# Patient Record
Sex: Female | Born: 1970 | Race: White | Hispanic: No | Marital: Single | Smoking: Never smoker
Health system: Southern US, Community
[De-identification: ages and names within clinical notes are randomized; demographics above are authoritative.]

## PROBLEM LIST (undated history)

## (undated) DIAGNOSIS — M1990 Unspecified osteoarthritis, unspecified site: Secondary | ICD-10-CM

## (undated) DIAGNOSIS — C439 Malignant melanoma of skin, unspecified: Secondary | ICD-10-CM

## (undated) HISTORY — PX: BACK SURGERY: SHX140

## (undated) HISTORY — PX: TONSILLECTOMY: SUR1361

## (undated) HISTORY — DX: Unspecified osteoarthritis, unspecified site: M19.90

---

## 1998-06-08 ENCOUNTER — Other Ambulatory Visit: Admission: RE | Admit: 1998-06-08 | Discharge: 1998-06-08 | Payer: Self-pay | Admitting: Obstetrics and Gynecology

## 1998-06-26 ENCOUNTER — Other Ambulatory Visit: Admission: RE | Admit: 1998-06-26 | Discharge: 1998-06-26 | Payer: Self-pay | Admitting: Obstetrics and Gynecology

## 1998-07-16 ENCOUNTER — Inpatient Hospital Stay (HOSPITAL_COMMUNITY): Admission: AD | Admit: 1998-07-16 | Discharge: 1998-07-16 | Payer: Self-pay | Admitting: Obstetrics & Gynecology

## 1998-10-29 ENCOUNTER — Other Ambulatory Visit: Admission: RE | Admit: 1998-10-29 | Discharge: 1998-10-29 | Payer: Self-pay | Admitting: Obstetrics and Gynecology

## 1999-03-11 ENCOUNTER — Other Ambulatory Visit: Admission: RE | Admit: 1999-03-11 | Discharge: 1999-03-11 | Payer: Self-pay | Admitting: Obstetrics and Gynecology

## 2000-03-15 ENCOUNTER — Other Ambulatory Visit: Admission: RE | Admit: 2000-03-15 | Discharge: 2000-03-15 | Payer: Self-pay | Admitting: Obstetrics and Gynecology

## 2001-03-02 ENCOUNTER — Other Ambulatory Visit: Admission: RE | Admit: 2001-03-02 | Discharge: 2001-03-02 | Payer: Self-pay | Admitting: Obstetrics and Gynecology

## 2002-03-08 ENCOUNTER — Other Ambulatory Visit: Admission: RE | Admit: 2002-03-08 | Discharge: 2002-03-08 | Payer: Self-pay | Admitting: Obstetrics and Gynecology

## 2003-08-13 ENCOUNTER — Other Ambulatory Visit: Admission: RE | Admit: 2003-08-13 | Discharge: 2003-08-13 | Payer: Self-pay | Admitting: Obstetrics and Gynecology

## 2004-09-27 ENCOUNTER — Other Ambulatory Visit: Admission: RE | Admit: 2004-09-27 | Discharge: 2004-09-27 | Payer: Self-pay | Admitting: Obstetrics and Gynecology

## 2012-07-02 DIAGNOSIS — J029 Acute pharyngitis, unspecified: Secondary | ICD-10-CM | POA: Insufficient documentation

## 2013-01-01 DIAGNOSIS — J209 Acute bronchitis, unspecified: Secondary | ICD-10-CM | POA: Insufficient documentation

## 2013-01-01 DIAGNOSIS — R197 Diarrhea, unspecified: Secondary | ICD-10-CM | POA: Insufficient documentation

## 2013-01-10 DIAGNOSIS — R059 Cough, unspecified: Secondary | ICD-10-CM | POA: Insufficient documentation

## 2013-02-27 ENCOUNTER — Observation Stay (HOSPITAL_COMMUNITY)
Admission: EM | Admit: 2013-02-27 | Discharge: 2013-03-02 | Disposition: A | Discharge status: Home / Self Care | Payer: BC Managed Care – PPO | Attending: Orthopaedic Surgery | Admitting: Orthopaedic Surgery

## 2013-02-27 ENCOUNTER — Encounter (HOSPITAL_COMMUNITY): Payer: Self-pay | Admitting: Emergency Medicine

## 2013-02-27 DIAGNOSIS — Z7982 Long term (current) use of aspirin: Secondary | ICD-10-CM | POA: Insufficient documentation

## 2013-02-27 DIAGNOSIS — Z885 Allergy status to narcotic agent status: Secondary | ICD-10-CM | POA: Insufficient documentation

## 2013-02-27 DIAGNOSIS — Z888 Allergy status to other drugs, medicaments and biological substances status: Secondary | ICD-10-CM | POA: Insufficient documentation

## 2013-02-27 DIAGNOSIS — Z882 Allergy status to sulfonamides status: Secondary | ICD-10-CM | POA: Insufficient documentation

## 2013-02-27 DIAGNOSIS — W010XXA Fall on same level from slipping, tripping and stumbling without subsequent striking against object, initial encounter: Secondary | ICD-10-CM | POA: Insufficient documentation

## 2013-02-27 DIAGNOSIS — Z85820 Personal history of malignant melanoma of skin: Secondary | ICD-10-CM | POA: Insufficient documentation

## 2013-02-27 DIAGNOSIS — S82853A Displaced trimalleolar fracture of unspecified lower leg, initial encounter for closed fracture: Principal | ICD-10-CM | POA: Insufficient documentation

## 2013-02-27 HISTORY — DX: Malignant melanoma of skin, unspecified: C43.9

## 2013-02-27 MED ORDER — OXYCODONE-ACETAMINOPHEN 5-325 MG PO TABS
1.0000 | ORAL_TABLET | Freq: Once | ORAL | Status: AC
  Administered 2013-02-27: 1 via ORAL
  Filled 2013-02-27: qty 1

## 2013-02-27 NOTE — ED Notes (Signed)
43 yo female walking to house slipped and fell on snow heard her Left ankle pop. Presents by [facility] with Left ankle deformity, swollen and rotated outward. No LOC or injury to head or there musculoskeletal areas. Painful to touch. No medical HX.

## 2013-02-27 NOTE — ED Notes (Signed)
MD at bedside. 

## 2013-02-27 NOTE — ED Provider Notes (Signed)
CSN: [id number]     Arrival date & time 02/27/13  2242 History   First MD Initiated Contact with Patient 02/27/13 2253     Chief Complaint  Patient presents with  . Ankle Injury     (Consider location/radiation/quality/duration/timing/severity/associated sxs/prior Treatment) HPI  This is a 43 year old female with no significant past medical history who presents with a left ankle injury. Patient states that she was walking on a slippery sidewalk when she slipped and went of her left ankle. She heard a pop. She did not hit her head or lose consciousness. She noted a deformity.  Patient reports the pain is controlled as long as her ankle is immobile. She denies any other injury or pain. She denies syncope.   Past Medical History  Diagnosis Date  . Melanoma     43 yo on back   Past Surgical History  Procedure Laterality Date  . Tonsillectomy     No family history on file. History  Substance Use Topics  . Smoking status: Never Smoker   . Smokeless tobacco: Never Used  . Alcohol Use: No   OB History   Grav Para Term Preterm Abortions TAB SAB Ect Mult Living                 Review of Systems  Respiratory: Negative for chest tightness and shortness of breath.   Cardiovascular: Negative for chest pain.  Gastrointestinal: Positive for nausea and diarrhea. Negative for vomiting and abdominal pain.  Genitourinary: Negative for dysuria.  Musculoskeletal: Negative for back pain.       Left ankle pain and swelling  Skin: Negative for wound.  Neurological: Negative for headaches.  All other systems reviewed and are negative.      Allergies  Codeine; Prednisone; and Sulfa antibiotics  Home Medications   Current Outpatient Rx  Name  Route  Sig  Dispense  Refill  . aspirin EC 81 MG tablet   Oral   Take 81 mg by mouth daily.         . cetirizine (ZYRTEC) 10 MG tablet   Oral   Take 10 mg by mouth daily.         Marland Kitchen omeprazole (PRILOSEC) 40 MG capsule   Oral   Take 40  mg by mouth daily.          BP 121/92  Pulse 97  Temp(Src) 98.7 F (37.1 C) (Oral)  Resp 20  SpO2 97%  LMP 02/24/2013 Physical Exam  Nursing note and vitals reviewed. Constitutional: She is oriented to person, place, and time. She appears well-developed and well-nourished. No distress.  Overweight  HENT:  Head: Normocephalic and atraumatic.  Cardiovascular: Normal rate and regular rhythm.   Pulmonary/Chest: Effort normal. No respiratory distress.  Abdominal: Soft. There is no tenderness.  Musculoskeletal:  Diffuse swelling of the left ankle, notable ecchymosis over the medial malleolus with tenderness to palpation, no midfoot tenderness, 2+ DP pulse, neurovascularly intact distally, no proximal fibular tenderness  Neurological: She is alert and oriented to person, place, and time.  Skin: Skin is warm and dry.  Psychiatric: She has a normal mood and affect.    ED Course  Procedures (including critical care time) Labs Review Labs Reviewed  CBC WITH DIFFERENTIAL  BASIC METABOLIC PANEL  POC URINE PREG, ED   Imaging Review Dg Ankle Complete Left  02/28/2013   CLINICAL DATA:  Fall  EXAM: LEFT ANKLE COMPLETE - 3+ VIEW  COMPARISON:  None.  FINDINGS: There is an acute  oblique fracture through the distal left fibula with intra-articular extension. There is slight lateral displacement at this fracture. Additional fracture of the medial malleolus with slight medial displacement is for present. There is an additional fracture of the posterior malleolus with posterior displacement. There is asymmetric widening of the medial ankle mortise. Talar dome is intact. Calcaneus is intact. Base of fifth metatarsal is intact. Diffuse soft tissue swelling present about the ankle. Calcific density present at the posterior aspect of the calcaneus at the insertion site of the Achilles tendon is likely chronic in nature.  IMPRESSION: Acute trimalleolar fracture as above with associated widening of the ankle  mortise.   Electronically Signed   By: Jeannine Boga M.D.   On: 02/28/2013 00:40    EKG Interpretation   None       MDM   Final diagnoses:  Trimalleolar fracture of ankle, closed    Patient presents with left ankle injury and deformity. No other obvious evidence of injury. She is neurovascularly intact with a good DP pulse. There is no proximal fibular tenderness or deformity. X-rays notable for unstable bimalleolar fracture with mortise widening. Discussed with Dr. Lorin Mercy. Patient will be admitted to Dr. Lorin Mercy service. He will see her in the morning. Temporary admission orders placed. CBC, BMP, urine pregnancy pending.  Short-leg splint placed in the ER.    Merryl Hacker, MD 02/28/13 0111

## 2013-02-28 ENCOUNTER — Observation Stay (HOSPITAL_COMMUNITY): Payer: BC Managed Care – PPO | Admitting: Anesthesiology

## 2013-02-28 ENCOUNTER — Emergency Department (HOSPITAL_COMMUNITY): Payer: BC Managed Care – PPO

## 2013-02-28 ENCOUNTER — Encounter (HOSPITAL_COMMUNITY): Payer: BC Managed Care – PPO | Admitting: Anesthesiology

## 2013-02-28 ENCOUNTER — Encounter (HOSPITAL_COMMUNITY)
Admission: EM | Disposition: A | Discharge status: Home / Self Care | Payer: Self-pay | Source: Home / Self Care | Attending: Emergency Medicine

## 2013-02-28 ENCOUNTER — Encounter (HOSPITAL_COMMUNITY): Payer: Self-pay | Admitting: *Deleted

## 2013-02-28 ENCOUNTER — Observation Stay (HOSPITAL_COMMUNITY): Payer: BC Managed Care – PPO

## 2013-02-28 DIAGNOSIS — S82853A Displaced trimalleolar fracture of unspecified lower leg, initial encounter for closed fracture: Secondary | ICD-10-CM | POA: Diagnosis present

## 2013-02-28 HISTORY — PX: ORIF ANKLE FRACTURE: SHX5408

## 2013-02-28 LAB — BASIC METABOLIC PANEL
BUN: 9 mg/dL (ref 6–23)
CO2: 25 mEq/L (ref 19–32)
Calcium: 9.6 mg/dL (ref 8.4–10.5)
Chloride: 103 mEq/L (ref 96–112)
Creatinine, Ser: 0.66 mg/dL (ref 0.50–1.10)
GFR calc Af Amer: >90 mL/min (ref >90)
GLUCOSE: 107 mg/dL — AB (ref 70–99)
Potassium: 3.9 mEq/L (ref 3.7–5.3)
SODIUM: 140 meq/L (ref 137–147)

## 2013-02-28 LAB — CBC WITH DIFFERENTIAL/PLATELET
Basophils Absolute: 0 10*3/uL (ref 0.0–0.1)
Basophils Relative: 0 % (ref 0–1)
EOS ABS: 0.1 10*3/uL (ref 0.0–0.7)
EOS PCT: 1 % (ref 0–5)
HCT: 37.9 % (ref 36.0–46.0)
Hemoglobin: 12.8 g/dL (ref 12.0–15.0)
LYMPHS ABS: 1.2 10*3/uL (ref 0.7–4.0)
Lymphocytes Relative: 9 % — ABNORMAL LOW (ref 12–46)
MCH: 28.8 pg (ref 26.0–34.0)
MCHC: 33.8 g/dL (ref 30.0–36.0)
MCV: 85.4 fL (ref 78.0–100.0)
Monocytes Absolute: 0.8 10*3/uL (ref 0.1–1.0)
Monocytes Relative: 6 % (ref 3–12)
Neutro Abs: 11.4 10*3/uL — ABNORMAL HIGH (ref 1.7–7.7)
Neutrophils Relative %: 84 % — ABNORMAL HIGH (ref 43–77)
PLATELETS: 341 10*3/uL (ref 150–400)
RBC: 4.44 MIL/uL (ref 3.87–5.11)
RDW: 13.4 % (ref 11.5–15.5)
WBC: 13.5 10*3/uL — ABNORMAL HIGH (ref 4.0–10.5)

## 2013-02-28 LAB — POC URINE PREG, ED: Preg Test, Ur: NEGATIVE

## 2013-02-28 SURGERY — OPEN REDUCTION INTERNAL FIXATION (ORIF) ANKLE FRACTURE
Anesthesia: Regional | Site: Ankle | Laterality: Left

## 2013-02-28 MED ORDER — HYDROMORPHONE HCL PF 1 MG/ML IJ SOLN
1.0000 mg | INTRAMUSCULAR | Status: AC | PRN
  Administered 2013-02-28 (×2): 1 mg via INTRAVENOUS
  Filled 2013-02-28 (×2): qty 1

## 2013-02-28 MED ORDER — DEXTROSE 5 % IV SOLN
INTRAVENOUS | Status: DC | PRN
  Administered 2013-02-28: 13:00:00 via INTRAVENOUS

## 2013-02-28 MED ORDER — MIDAZOLAM HCL 2 MG/2ML IJ SOLN
INTRAMUSCULAR | Status: AC
  Filled 2013-02-28: qty 2

## 2013-02-28 MED ORDER — PANTOPRAZOLE SODIUM 40 MG PO TBEC
40.0000 mg | DELAYED_RELEASE_TABLET | Freq: Every day | ORAL | Status: DC
  Administered 2013-02-28 – 2013-03-02 (×2): 40 mg via ORAL

## 2013-02-28 MED ORDER — DOCUSATE SODIUM 100 MG PO CAPS
100.0000 mg | ORAL_CAPSULE | Freq: Two times a day (BID) | ORAL | Status: DC
  Administered 2013-02-28 – 2013-03-02 (×4): 100 mg via ORAL
  Filled 2013-02-28 (×4): qty 1

## 2013-02-28 MED ORDER — LACTATED RINGERS IV SOLN
INTRAVENOUS | Status: DC | PRN
  Administered 2013-02-28: 11:00:00 via INTRAVENOUS

## 2013-02-28 MED ORDER — ONDANSETRON HCL 4 MG/2ML IJ SOLN
4.0000 mg | Freq: Once | INTRAMUSCULAR | Status: AC
  Administered 2013-02-28: 4 mg via INTRAVENOUS
  Filled 2013-02-28: qty 2

## 2013-02-28 MED ORDER — HYDROMORPHONE HCL PF 1 MG/ML IJ SOLN
0.5000 mg | INTRAMUSCULAR | Status: DC | PRN
Start: 2013-02-28 — End: 2013-03-02
  Administered 2013-03-01: 1 mg via INTRAVENOUS
  Filled 2013-02-28: qty 1

## 2013-02-28 MED ORDER — METHOCARBAMOL 500 MG PO TABS
500.0000 mg | ORAL_TABLET | Freq: Four times a day (QID) | ORAL | Status: DC | PRN
Start: 2013-02-28 — End: 2013-03-02
  Administered 2013-03-01 – 2013-03-02 (×3): 500 mg via ORAL
  Filled 2013-02-28 (×3): qty 1

## 2013-02-28 MED ORDER — FENTANYL CITRATE 0.05 MG/ML IJ SOLN
INTRAMUSCULAR | Status: AC
  Administered 2013-02-28: 100 ug
  Filled 2013-02-28: qty 2

## 2013-02-28 MED ORDER — CEFAZOLIN SODIUM-DEXTROSE 2-3 GM-% IV SOLR
2.0000 g | Freq: Once | INTRAVENOUS | Status: AC
  Administered 2013-02-28: 2 g via INTRAVENOUS
  Filled 2013-02-28 (×2): qty 50

## 2013-02-28 MED ORDER — BUPIVACAINE HCL (PF) 0.5 % IJ SOLN
INTRAMUSCULAR | Status: DC | PRN
  Administered 2013-02-28: 15 mL

## 2013-02-28 MED ORDER — PROPOFOL 10 MG/ML IV BOLUS
INTRAVENOUS | Status: DC | PRN
  Administered 2013-02-28: 150 mg via INTRAVENOUS
  Administered 2013-02-28: 20 mg via INTRAVENOUS

## 2013-02-28 MED ORDER — POLYETHYLENE GLYCOL 3350 17 G PO PACK
17.0000 g | PACK | Freq: Every day | ORAL | Status: DC | PRN
  Administered 2013-03-01: 17 g via ORAL
  Filled 2013-02-28 (×2): qty 1

## 2013-02-28 MED ORDER — LIDOCAINE HCL (CARDIAC) 20 MG/ML IV SOLN
INTRAVENOUS | Status: AC
  Filled 2013-02-28: qty 5

## 2013-02-28 MED ORDER — METOCLOPRAMIDE HCL 5 MG/ML IJ SOLN
5.0000 mg | Freq: Three times a day (TID) | INTRAMUSCULAR | Status: DC | PRN
  Filled 2013-02-28: qty 2

## 2013-02-28 MED ORDER — SODIUM CHLORIDE 0.9 % IV SOLN
INTRAVENOUS | Status: AC
  Administered 2013-02-28: 1000 mL via INTRAVENOUS

## 2013-02-28 MED ORDER — FENTANYL CITRATE 0.05 MG/ML IJ SOLN
INTRAMUSCULAR | Status: DC | PRN
  Administered 2013-02-28: 100 ug via INTRAVENOUS

## 2013-02-28 MED ORDER — CEFAZOLIN SODIUM-DEXTROSE 2-3 GM-% IV SOLR
2.0000 g | Freq: Once | INTRAVENOUS | Status: DC
  Filled 2013-02-28: qty 50

## 2013-02-28 MED ORDER — 0.9 % SODIUM CHLORIDE (POUR BTL) OPTIME
TOPICAL | Status: DC | PRN
  Administered 2013-02-28: 1000 mL

## 2013-02-28 MED ORDER — LIDOCAINE HCL (CARDIAC) 20 MG/ML IV SOLN
INTRAVENOUS | Status: DC | PRN
  Administered 2013-02-28: 60 mg via INTRAVENOUS

## 2013-02-28 MED ORDER — ASPIRIN EC 81 MG PO TBEC
81.0000 mg | DELAYED_RELEASE_TABLET | Freq: Every day | ORAL | Status: DC
Start: 2013-02-28 — End: 2013-03-02
  Administered 2013-02-28 – 2013-03-02 (×3): 81 mg via ORAL
  Filled 2013-02-28 (×4): qty 1

## 2013-02-28 MED ORDER — BUPIVACAINE-EPINEPHRINE PF 0.5-1:200000 % IJ SOLN
INTRAMUSCULAR | Status: DC | PRN
  Administered 2013-02-28: 30 mL via PERINEURAL

## 2013-02-28 MED ORDER — CEFAZOLIN SODIUM-DEXTROSE 2-3 GM-% IV SOLR
INTRAVENOUS | Status: DC | PRN
  Administered 2013-02-28: 2 g via INTRAVENOUS

## 2013-02-28 MED ORDER — ARTIFICIAL TEARS OP OINT
TOPICAL_OINTMENT | OPHTHALMIC | Status: DC | PRN
  Administered 2013-02-28: 1 via OPHTHALMIC

## 2013-02-28 MED ORDER — HYDROMORPHONE HCL PF 1 MG/ML IJ SOLN
1.0000 mg | Freq: Once | INTRAMUSCULAR | Status: AC
  Administered 2013-02-28: 1 mg via INTRAVENOUS
  Filled 2013-02-28: qty 1

## 2013-02-28 MED ORDER — MIDAZOLAM HCL 2 MG/2ML IJ SOLN
INTRAMUSCULAR | Status: AC
  Administered 2013-02-28: 2 mg
  Filled 2013-02-28: qty 2

## 2013-02-28 MED ORDER — ONDANSETRON HCL 4 MG/2ML IJ SOLN: 4.0000 mg | Freq: Four times a day (QID) | INTRAMUSCULAR | Status: DC | PRN

## 2013-02-28 MED ORDER — ONDANSETRON HCL 4 MG/2ML IJ SOLN
4.0000 mg | Freq: Three times a day (TID) | INTRAMUSCULAR | Status: DC | PRN
  Administered 2013-02-28: 4 mg via INTRAVENOUS
  Filled 2013-02-28: qty 2

## 2013-02-28 MED ORDER — ARTIFICIAL TEARS OP OINT
TOPICAL_OINTMENT | OPHTHALMIC | Status: AC
  Filled 2013-02-28: qty 3.5

## 2013-02-28 MED ORDER — OXYCODONE-ACETAMINOPHEN 5-325 MG PO TABS
1.0000 | ORAL_TABLET | ORAL | Status: DC | PRN
  Administered 2013-03-01 – 2013-03-02 (×6): 2 via ORAL
  Filled 2013-02-28 (×6): qty 2

## 2013-02-28 MED ORDER — HYDROMORPHONE HCL PF 1 MG/ML IJ SOLN
INTRAMUSCULAR | Status: AC
  Filled 2013-02-28: qty 1

## 2013-02-28 MED ORDER — ONDANSETRON HCL 4 MG/2ML IJ SOLN
INTRAMUSCULAR | Status: AC
  Filled 2013-02-28: qty 2

## 2013-02-28 MED ORDER — ONDANSETRON HCL 4 MG PO TABS: 4.0000 mg | ORAL_TABLET | Freq: Four times a day (QID) | ORAL | Status: DC | PRN

## 2013-02-28 MED ORDER — PROPOFOL 10 MG/ML IV BOLUS
INTRAVENOUS | Status: AC
Start: 2013-02-28 — End: 2013-02-28
  Filled 2013-02-28: qty 20

## 2013-02-28 MED ORDER — MORPHINE SULFATE 4 MG/ML IJ SOLN
4.0000 mg | Freq: Once | INTRAMUSCULAR | Status: AC
  Administered 2013-02-28: 4 mg via INTRAVENOUS
  Filled 2013-02-28: qty 1

## 2013-02-28 MED ORDER — ONDANSETRON HCL 4 MG/2ML IJ SOLN
INTRAMUSCULAR | Status: DC | PRN
  Administered 2013-02-28: 4 mg via INTRAVENOUS

## 2013-02-28 MED ORDER — METOCLOPRAMIDE HCL 5 MG PO TABS
5.0000 mg | ORAL_TABLET | Freq: Three times a day (TID) | ORAL | Status: DC | PRN
  Filled 2013-02-28: qty 2

## 2013-02-28 MED ORDER — LORATADINE 10 MG PO TABS
10.0000 mg | ORAL_TABLET | Freq: Every day | ORAL | Status: DC
  Administered 2013-02-28 – 2013-03-02 (×3): 10 mg via ORAL
  Filled 2013-02-28 (×3): qty 1

## 2013-02-28 MED ORDER — BUPIVACAINE HCL (PF) 0.5 % IJ SOLN: INTRAMUSCULAR | Status: DC | PRN

## 2013-02-28 MED ORDER — HYDROMORPHONE HCL PF 1 MG/ML IJ SOLN
0.2500 mg | INTRAMUSCULAR | Status: DC | PRN
  Administered 2013-02-28: 0.5 mg via INTRAVENOUS

## 2013-02-28 MED ORDER — BUPIVACAINE HCL (PF) 0.25 % IJ SOLN
INTRAMUSCULAR | Status: AC
  Filled 2013-02-28: qty 30

## 2013-02-28 MED ORDER — METHOCARBAMOL 100 MG/ML IJ SOLN
500.0000 mg | Freq: Four times a day (QID) | INTRAMUSCULAR | Status: DC | PRN
  Filled 2013-02-28: qty 5

## 2013-02-28 MED ORDER — BUPIVACAINE HCL (PF) 0.25 % IJ SOLN
INTRAMUSCULAR | Status: DC | PRN
Start: 2013-02-28 — End: 2013-02-28
  Administered 2013-02-28: 10 mL

## 2013-02-28 MED ORDER — SODIUM CHLORIDE 0.9 % IV SOLN
INTRAVENOUS | Status: DC
  Administered 2013-03-01: 08:00:00 via INTRAVENOUS

## 2013-02-28 MED ORDER — CEFAZOLIN SODIUM-DEXTROSE 2-3 GM-% IV SOLR
INTRAVENOUS | Status: AC
  Filled 2013-02-28: qty 50

## 2013-02-28 MED ORDER — FENTANYL CITRATE 0.05 MG/ML IJ SOLN
INTRAMUSCULAR | Status: AC
  Filled 2013-02-28: qty 5

## 2013-02-28 SURGICAL SUPPLY — 66 items
BANDAGE ELASTIC 4 VELCRO ST LF (GAUZE/BANDAGES/DRESSINGS) ×2 IMPLANT
BANDAGE ELASTIC 6 VELCRO ST LF (GAUZE/BANDAGES/DRESSINGS) ×2 IMPLANT
BANDAGE ESMARK 6X9 LF (GAUZE/BANDAGES/DRESSINGS) IMPLANT
BIT DRILL 2.5X110 QC LCP DISP (BIT) ×2 IMPLANT
BNDG CMPR 9X6 STRL LF SNTH (GAUZE/BANDAGES/DRESSINGS)
BNDG ESMARK 6X9 LF (GAUZE/BANDAGES/DRESSINGS)
CLOTH BEACON ORANGE TIMEOUT ST (SAFETY) ×3 IMPLANT
COVER MAYO STAND STRL (DRAPES) ×5 IMPLANT
COVER SURGICAL LIGHT HANDLE (MISCELLANEOUS) ×5 IMPLANT
CUFF TOURNIQUET SINGLE 34IN LL (TOURNIQUET CUFF) ×2 IMPLANT
CUFF TOURNIQUET SINGLE 44IN (TOURNIQUET CUFF) IMPLANT
DRAPE C-ARM 42X72 X-RAY (DRAPES) ×2 IMPLANT
DRAPE INCISE IOBAN 66X45 STRL (DRAPES) ×3 IMPLANT
DRAPE PROXIMA HALF (DRAPES) ×3 IMPLANT
DRAPE U-SHAPE 47X51 STRL (DRAPES) ×3 IMPLANT
DRSG PAD ABDOMINAL 8X10 ST (GAUZE/BANDAGES/DRESSINGS) ×3 IMPLANT
DURAPREP 26ML APPLICATOR (WOUND CARE) ×3 IMPLANT
ELECT REM PT RETURN 9FT ADLT (ELECTROSURGICAL) ×3
ELECTRODE REM PT RTRN 9FT ADLT (ELECTROSURGICAL) ×1 IMPLANT
GAUZE XEROFORM 5X9 LF (GAUZE/BANDAGES/DRESSINGS) ×3 IMPLANT
GLOVE BIOGEL PI IND STRL 7.5 (GLOVE) ×1 IMPLANT
GLOVE BIOGEL PI IND STRL 8 (GLOVE) ×1 IMPLANT
GLOVE BIOGEL PI INDICATOR 7.5 (GLOVE) ×2
GLOVE BIOGEL PI INDICATOR 8 (GLOVE) ×2
GLOVE ECLIPSE 7.0 STRL STRAW (GLOVE) ×3 IMPLANT
GLOVE ORTHO TXT STRL SZ7.5 (GLOVE) ×3 IMPLANT
GOWN PREVENTION PLUS LG XLONG (DISPOSABLE) IMPLANT
GOWN PREVENTION PLUS XLARGE (GOWN DISPOSABLE) ×3 IMPLANT
GOWN STRL NON-REIN LRG LVL3 (GOWN DISPOSABLE) ×6 IMPLANT
KIT 1/3 TUB PL 7H 85M (Orthopedic Implant) IMPLANT
KIT BASIN OR (CUSTOM PROCEDURE TRAY) ×3 IMPLANT
KIT ROOM TURNOVER OR (KITS) ×3 IMPLANT
MANIFOLD NEPTUNE II (INSTRUMENTS) ×3 IMPLANT
NS IRRIG 1000ML POUR BTL (IV SOLUTION) ×3 IMPLANT
PACK ORTHO EXTREMITY (CUSTOM PROCEDURE TRAY) ×3 IMPLANT
PAD ARMBOARD 7.5X6 YLW CONV (MISCELLANEOUS) ×6 IMPLANT
PAD CAST 4YDX4 CTTN HI CHSV (CAST SUPPLIES) ×1 IMPLANT
PADDING CAST COTTON 4X4 STRL (CAST SUPPLIES) ×3
PADDING CAST COTTON 6X4 STRL (CAST SUPPLIES) ×3 IMPLANT
PADDING CAST SYN 6 (CAST SUPPLIES) ×2
PADDING CAST SYNTHETIC 6X4 NS (CAST SUPPLIES) IMPLANT
PROS 1/3 TUB PL 7H 85M (Orthopedic Implant) ×3 IMPLANT
SCREW CANC FT 12 4.0 (Screw) ×2 IMPLANT
SCREW CANC FT ST SFS 4X14 (Screw) ×2 IMPLANT
SCREW CANC PT 4.0X40 (Screw) ×3 IMPLANT
SCREW CANC PT 4.0X45 (Screw) ×2 IMPLANT
SCREW CANC PT 40X14X4X6X (Screw) IMPLANT
SCREW CORTEX 3.5 12MM (Screw) ×4 IMPLANT
SCREW CORTEX 3.5 14MM (Screw) ×2 IMPLANT
SCREW CORTEX 3.5 22MM (Screw) ×2 IMPLANT
SCREW LOCK CORT ST 3.5X12 (Screw) IMPLANT
SCREW LOCK CORT ST 3.5X14 (Screw) IMPLANT
SCREW LOCK CORT ST 3.5X22 (Screw) IMPLANT
SPONGE GAUZE 4X4 12PLY (GAUZE/BANDAGES/DRESSINGS) ×5 IMPLANT
SPONGE LAP 18X18 X RAY DECT (DISPOSABLE) ×3 IMPLANT
STAPLER VISISTAT 35W (STAPLE) ×2 IMPLANT
SUCTION FRAZIER TIP 10 FR DISP (SUCTIONS) ×3 IMPLANT
SUT ETHILON 3 0 PS 1 (SUTURE) ×2 IMPLANT
SUT VIC AB 2-0 CT1 27 (SUTURE) ×6
SUT VIC AB 2-0 CT1 TAPERPNT 27 (SUTURE) ×2 IMPLANT
TOWEL OR 17X24 6PK STRL BLUE (TOWEL DISPOSABLE) ×3 IMPLANT
TOWEL OR 17X26 10 PK STRL BLUE (TOWEL DISPOSABLE) ×3 IMPLANT
TUBE CONNECTING 12'X1/4 (SUCTIONS) ×1
TUBE CONNECTING 12X1/4 (SUCTIONS) ×2 IMPLANT
WATER STERILE IRR 1000ML POUR (IV SOLUTION) ×3 IMPLANT
YANKAUER SUCT BULB TIP NO VENT (SUCTIONS) ×3 IMPLANT

## 2013-02-28 NOTE — Progress Notes (Signed)
Called for report, first attempt. ED RN to call back for report.

## 2013-02-28 NOTE — H&P (Signed)
[name] [name] is an 43 y.o. female.   Chief Complaint: slipped going down the hill to check on house next door. Left ankle deformity and pain.  HPI: no Hx of ankle injury, works as Medical illustrator, lives in [place].   Past Medical History  Diagnosis Date  . Melanoma     43 yo on back    Past Surgical History  Procedure Laterality Date  . Tonsillectomy      History reviewed. No pertinent family history. Social History:  reports that she has never smoked. She has never used smokeless tobacco. She reports that she does not drink alcohol or use illicit drugs.  Allergies:  Allergies  Allergen Reactions  . Codeine Itching and Nausea And Vomiting  . Prednisone Itching  . Sulfa Antibiotics Itching    Medications Prior to Admission  Medication Sig Dispense Refill  . aspirin EC 81 MG tablet Take 81 mg by mouth daily.      . cetirizine (ZYRTEC) 10 MG tablet Take 10 mg by mouth daily.      Marland Kitchen omeprazole (PRILOSEC) 40 MG capsule Take 40 mg by mouth daily.        Results for orders placed during the hospital encounter of 02/27/13 (from the past 48 hour(s))  CBC WITH DIFFERENTIAL     Status: Abnormal   Collection Time    02/28/13 12:52 AM      Result Value Ref Range   WBC 13.5 (*) 4.0 - 10.5 K/uL   RBC 4.44  3.87 - 5.11 MIL/uL   Hemoglobin 12.8  12.0 - 15.0 g/dL   HCT 37.9  36.0 - 46.0 %   MCV 85.4  78.0 - 100.0 fL   MCH 28.8  26.0 - 34.0 pg   MCHC 33.8  30.0 - 36.0 g/dL   RDW 13.4  11.5 - 15.5 %   Platelets 341  150 - 400 K/uL   Neutrophils Relative % 84 (*) 43 - 77 %   Neutro Abs 11.4 (*) 1.7 - 7.7 K/uL   Lymphocytes Relative 9 (*) 12 - 46 %   Lymphs Abs 1.2  0.7 - 4.0 K/uL   Monocytes Relative 6  3 - 12 %   Monocytes Absolute 0.8  0.1 - 1.0 K/uL   Eosinophils Relative 1  0 - 5 %   Eosinophils Absolute 0.1  0.0 - 0.7 K/uL   Basophils Relative 0  0 - 1 %   Basophils Absolute 0.0  0.0 - 0.1 K/uL  BASIC METABOLIC PANEL     Status: Abnormal   Collection Time    02/28/13  12:52 AM      Result Value Ref Range   Sodium 140  137 - 147 mEq/L   Potassium 3.9  3.7 - 5.3 mEq/L   Chloride 103  96 - 112 mEq/L   CO2 25  19 - 32 mEq/L   Glucose, Bld 107 (*) 70 - 99 mg/dL   BUN 9  6 - 23 mg/dL   Creatinine, Ser 0.66  0.50 - 1.10 mg/dL   Calcium 9.6  8.4 - 10.5 mg/dL   GFR calc non Af Amer >90  >90 mL/min   GFR calc Af Amer >90  >90 mL/min   Comment: (NOTE)     The eGFR has been calculated using the CKD EPI equation.     This calculation has not been validated in all clinical situations.     eGFR's persistently <90 mL/min signify possible Chronic Kidney  Disease.  POC URINE PREG, ED     Status: None   Collection Time    02/28/13  1:40 AM      Result Value Ref Range   Preg Test, Ur NEGATIVE  NEGATIVE   Comment:            THE SENSITIVITY OF THIS     METHODOLOGY IS >24 mIU/mL   Dg Ankle Complete Left  02/28/2013   CLINICAL DATA:  Fall  EXAM: LEFT ANKLE COMPLETE - 3+ VIEW  COMPARISON:  None.  FINDINGS: There is an acute oblique fracture through the distal left fibula with intra-articular extension. There is slight lateral displacement at this fracture. Additional fracture of the medial malleolus with slight medial displacement is for present. There is an additional fracture of the posterior malleolus with posterior displacement. There is asymmetric widening of the medial ankle mortise. Talar dome is intact. Calcaneus is intact. Base of fifth metatarsal is intact. Diffuse soft tissue swelling present about the ankle. Calcific density present at the posterior aspect of the calcaneus at the insertion site of the Achilles tendon is likely chronic in nature.  IMPRESSION: Acute trimalleolar fracture as above with associated widening of the ankle mortise.   Electronically Signed   By: Jeannine Boga M.D.   On: 02/28/2013 00:40    Review of Systems  Constitutional: Negative for fever, chills and weight loss.  Respiratory:       Pneumonia 4 yrs ago tx with ABX .    Gastrointestinal: Positive for heartburn.  Genitourinary:       Cervical conization  Musculoskeletal:       Previous right ankle fx 8th grade  Skin: Negative for itching and rash.  Endo/Heme/Allergies:       Melanoma age 5.   Psychiatric/Behavioral: Negative.     Blood pressure 114/70, pulse 81, temperature 97.9 F (36.6 C), temperature source Oral, resp. rate 16, height 5' 8"  (1.727 m), weight 115.7 kg (255 lb 1.2 oz), last menstrual period 02/24/2013, SpO2 96.00%. Physical Exam  Constitutional: She is oriented to person, place, and time. She appears well-developed and well-nourished.  HENT:  Head: Normocephalic and atraumatic.  Eyes: Pupils are equal, round, and reactive to light.  Neck: Normal range of motion.  Cardiovascular: Normal rate and regular rhythm.   Respiratory: Effort normal.  GI: Soft.  Musculoskeletal:  Left SLS . Sensation to toes normal  Neurological: She is alert and oriented to person, place, and time.  Skin: Skin is warm and dry.     Assessment/Plan   Left trimalleolar  Ankle fracture  For ORIF later this morning. Procedure discussed, NWB post op.   YATES,MARK C 02/28/2013, 9:05 AM

## 2013-02-28 NOTE — Transfer of Care (Signed)
Immediate Anesthesia Transfer of Care Note  Patient: [name] [name]  Procedure(s) Performed: Procedure(s): OPEN REDUCTION INTERNAL FIXATION (ORIF)  TRIMALEALLOR ANKLE FRACTURE (Left)  Patient Location: PACU  Anesthesia Type:General  Level of Consciousness: awake, sedated and patient cooperative  Airway & Oxygen Therapy: Patient Spontanous Breathing and Patient connected to nasal cannula oxygen  Post-op Assessment: Report given to PACU RN and Post -op Vital signs reviewed and stable  Post vital signs: Reviewed and stable  Complications: No apparent anesthesia complications

## 2013-02-28 NOTE — Brief Op Note (Signed)
02/27/2013 - 02/28/2013  2:27 PM  PATIENT:  [name] [name]  43 y.o. female  PRE-OPERATIVE DIAGNOSIS:  left ankle fracture, trimalleolar  POST-OPERATIVE DIAGNOSIS:  left ankle fracture,trimalleolar  PROCEDURE:  Procedure(s): OPEN REDUCTION INTERNAL FIXATION (ORIF)  TRIMALEALLOR ANKLE FRACTURE (Left)  SURGEON:  Surgeon(s) and Role:    * Marybelle Killings, MD - Primary  PHYSICIAN ASSISTANT:   ASSISTANTS: none   ANESTHESIA:   local, regional and general  EBL:  Total I/O In: 50 [I.V.:50] Out: -   BLOOD ADMINISTERED:none  DRAINS: none   LOCAL MEDICATIONS USED:  MARCAINE     SPECIMEN:  No Specimen  DISPOSITION OF SPECIMEN:  N/A  COUNTS:  YES  TOURNIQUET:   Total Tourniquet Time Documented: Thigh (Left) - 40 minutes Total: Thigh (Left) - 40 minutes   DICTATION: .Other Dictation: Dictation Number [id number]  PLAN OF CARE: already inpatient  PATIENT DISPOSITION:  PACU - hemodynamically stable.   Delay start of Pharmacological VTE agent (>24hrs) due to surgical blood loss or risk of bleeding: yes

## 2013-02-28 NOTE — Progress Notes (Signed)
Orthopedic Tech Progress Note Patient Details:  [name] [name] 1970-04-18 [id number]  Ortho Devices Type of Ortho Device: Stirrup splint;Short leg splint   Katheren Shams 02/28/2013, 1:45 AM

## 2013-02-28 NOTE — Anesthesia Procedure Notes (Addendum)
Anesthesia Regional Block:  Popliteal block  Pre-Anesthetic Checklist: ,, timeout performed, Correct Patient, Correct Site, Correct Laterality, Correct Procedure, Correct Position, site marked, Risks and benefits discussed, pre-op evaluation, post-op pain management  Laterality: Left  Prep: Maximum Sterile Barrier Precautions used and chloraprep       Needles:  Injection technique: Single-shot  Needle Type: Echogenic Stimulator Needle     Needle Length: 9cm 9 cm Needle Gauge: 21 and 21 G    Additional Needles:  Procedures: ultrasound guided (picture in chart) and nerve stimulator Popliteal block  Nerve Stimulator or Paresthesia:  Response: Peroneal,  Response: Tibial,   Additional Responses:   Narrative:  Start time: 02/28/2013 11:30 AM End time: 02/28/2013 11:40 AM Injection made incrementally with aspirations every 5 mL. Anesthesiologist: Ola Spurr, MD  Additional Notes: 2% Lidocaine skin wheel. Saphenous block with 10cc of 0.5% Bupivicaine plain.   Procedure Name: LMA Insertion Date/Time: 02/28/2013 1:16 PM Performed by: Williemae Area B Pre-anesthesia Checklist: Patient identified, Suction available, Patient being monitored and Emergency Drugs available Patient Re-evaluated:Patient Re-evaluated prior to inductionOxygen Delivery Method: Circle system utilized Preoxygenation: Pre-oxygenation with 100% oxygen Intubation Type: IV induction Ventilation: Mask ventilation without difficulty LMA: LMA inserted LMA Size: 4.0 Number of attempts: 1 Placement Confirmation: breath sounds checked- equal and bilateral and positive ETCO2 Tube secured with: taped across cheeks. Dental Injury: Teeth and Oropharynx as per pre-operative assessment

## 2013-02-28 NOTE — Interval H&P Note (Signed)
History and Physical Interval Note:  02/28/2013 11:39 AM  Marin Comment  has presented today for surgery, with the diagnosis of left ankle fracture  The various methods of treatment have been discussed with the patient and family. After consideration of risks, benefits and other options for treatment, the patient has consented to  Procedure(s): OPEN REDUCTION INTERNAL FIXATION (ORIF)  TRIMALEALLOR ANKLE FRACTURE (Left) as a surgical intervention .  The patient's history has been reviewed, patient examined, no change in status, stable for surgery.  I have reviewed the patient's chart and labs.  Questions were answered to the patient's satisfaction.     Jacqeline Broers C

## 2013-02-28 NOTE — H&P (View-Only) (Signed)
Patient ID: [name] [name], female   DOB: 03/26/1970, 42 y.o.   MRN: [id number] Plan OR today at 11:30  AM. Procedure discussed, informed consent obtained. Ancef ordered.  

## 2013-02-28 NOTE — ED Notes (Signed)
Patient transported to X-ray 

## 2013-02-28 NOTE — Progress Notes (Signed)
UR completed 

## 2013-02-28 NOTE — ED Notes (Signed)
Belongings placed in belongings bags.  House keys in boot.  Watch placed on her right arm.Marland Kitchen

## 2013-02-28 NOTE — Progress Notes (Signed)
Patient ID: [name] [name], female   DOB: [birth year], 43 y.o.   MRN: [id number] Plan OR today at 11:30  AM. Procedure discussed, informed consent obtained. Ancef ordered.

## 2013-02-28 NOTE — Preoperative (Signed)
Beta Blockers   Reason not to administer Beta Blockers:Not Applicable 

## 2013-02-28 NOTE — Anesthesia Preprocedure Evaluation (Addendum)
Anesthesia Evaluation  Patient identified by MRN, date of birth, ID band Patient awake    Reviewed: Allergy & Precautions, H&P , NPO status , Patient's Chart, lab work & pertinent test results  Airway Mallampati: II TM Distance: >3 FB Neck ROM: Full    Dental no notable dental hx. (+) Teeth Intact, Dental Advisory Given   Pulmonary neg pulmonary ROS,  breath sounds clear to auscultation  Pulmonary exam normal       Cardiovascular negative cardio ROS  Rhythm:Regular Rate:Normal     Neuro/Psych negative neurological ROS  negative psych ROS   GI/Hepatic Neg liver ROS, GERD-  Medicated and Controlled,  Endo/Other  negative endocrine ROS  Renal/GU negative Renal ROS  negative genitourinary   Musculoskeletal negative musculoskeletal ROS (+)   Abdominal   Peds negative pediatric ROS (+)  Hematology negative hematology ROS (+)   Anesthesia Other Findings   Reproductive/Obstetrics negative OB ROS                          Anesthesia Physical Anesthesia Plan  ASA: II  Anesthesia Plan: General and Regional   Post-op Pain Management:    Induction: Intravenous  Airway Management Planned: LMA  Additional Equipment:   Intra-op Plan:   Post-operative Plan: Extubation in OR  Informed Consent: I have reviewed the patients History and Physical, chart, labs and discussed the procedure including the risks, benefits and alternatives for the proposed anesthesia with the patient or authorized representative who has indicated his/her understanding and acceptance.   Dental advisory given  Plan Discussed with: CRNA and Surgeon  Anesthesia Plan Comments:         Anesthesia Quick Evaluation

## 2013-03-01 ENCOUNTER — Encounter (HOSPITAL_COMMUNITY): Payer: Self-pay | Admitting: Orthopaedic Surgery

## 2013-03-01 MED ORDER — OXYCODONE-ACETAMINOPHEN 5-325 MG PO TABS: 2.0000 | ORAL_TABLET | ORAL | Status: DC | PRN

## 2013-03-01 NOTE — Anesthesia Postprocedure Evaluation (Signed)
  Anesthesia Post-op Note  Patient: [name] [name]  Procedure(s) Performed: Procedure(s): OPEN REDUCTION INTERNAL FIXATION (ORIF)  TRIMALEALLOR ANKLE FRACTURE (Left)  Patient Location: PACU  Anesthesia Type:General and block  Level of Consciousness: awake and alert   Airway and Oxygen Therapy: Patient Spontanous Breathing  Post-op Pain: moderate  Post-op Assessment: Post-op Vital signs reviewed, Patient's Cardiovascular Status Stable and Respiratory Function Stable  Post-op Vital Signs: Reviewed  Filed Vitals:   03/01/13 0554  BP: 132/83  Pulse: 87  Temp: 37.6 C  Resp: 20    Complications: No apparent anesthesia complications

## 2013-03-01 NOTE — Care Management Note (Signed)
    Page 1 of 1   03/01/2013     10:46:45 AM   CARE MANAGEMENT NOTE 03/01/2013  Patient:  [name] [name]   Account Number:  [id number]  Date Initiated:  03/01/2013  Documentation initiated by:  Lorne Skeens  Subjective/Objective Assessment:   Patient admitted with ankle injury, ORIF. Lives at home alone     Action/Plan:   Will follow for discharge needs.   Anticipated DC Date:  03/02/2013   Anticipated DC Plan:  [place]  CM consult      Choice offered to / List presented to:     DME arranged  3-N-1      DME agency  [place].        Status of service:  Completed, signed off Medicare Important Message given?   (If response is "NO", the following Medicare IM given date fields will be blank) Date Medicare IM given:   Date Additional Medicare IM given:    Discharge Disposition:    Per UR Regulation:  Reviewed for med. necessity/level of care/duration of stay  If discussed at [place] of Stay Meetings, dates discussed:    Comments:  03/01/13 [place] RN, MSN, CM- [facility] was notified of order for 3n1 for discharge home tomorrow.

## 2013-03-01 NOTE — Evaluation (Signed)
Physical Therapy Evaluation Patient Details Name: [name] [name] MRN: [id number] DOB: 16-Jul-1970 Today's Date: 03/01/2013 Time: 6734-1937 PT Time Calculation (min): 34 min  PT Assessment / Plan / Recommendation History of Present Illness  pt with L ankle ORIF.    Clinical Impression  Pt very motivated and anticipate great progress.  Pt's mother will be staying with pt to A at home.  May need to consider W/C with elevating leg rest for home pending pt's progress with mobility.  Will continue to follow.      PT Assessment  Patient needs continued PT services    Follow Up Recommendations  No PT follow up;Supervision for mobility/OOB    Does the patient have the potential to tolerate intense rehabilitation      Barriers to Discharge        Equipment Recommendations  Rolling walker with 5" wheels;3in1 (PT)    Recommendations for Other Services     Frequency Min 5X/week    Precautions / Restrictions Precautions Precautions: Fall Restrictions Weight Bearing Restrictions: Yes LLE Weight Bearing: Touchdown weight bearing   Pertinent Vitals/Pain 3/10.  Premedicated.        Mobility  Bed Mobility Overal bed mobility: Modified Independent General bed mobility comments: HOB slightly elevated.   Transfers Overall transfer level: Needs assistance Equipment used: Rolling walker (2 wheeled) Transfers: Sit to/from Stand Sit to Stand: Min guard General transfer comment: cues for UE use and getting closer to toilet prior to sitting.   Ambulation/Gait Ambulation/Gait assistance: Min guard Ambulation Distance (Feet): 15 Feet (x2) Assistive device: Rolling walker (2 wheeled) Gait Pattern/deviations: Step-to pattern General Gait Details: cues to slow down for safety.  pt maintains [place] with L LE.      Exercises     PT Diagnosis: Difficulty walking;Acute pain  PT Problem List: Decreased activity tolerance;Decreased balance;Decreased mobility;Decreased knowledge of use of  DME;Decreased knowledge of precautions;Pain PT Treatment Interventions: DME instruction;Gait training;Functional mobility training;Therapeutic activities;Therapeutic exercise;Balance training;Patient/family education     PT Goals(Current goals can be found in the care plan section) Acute Rehab PT Goals Patient Stated Goal: Back to normal PT Goal Formulation: With patient Time For Goal Achievement: 03/08/13 Potential to Achieve Goals: Good  Visit Information  Last PT Received On: 03/01/13 Assistance Needed: +1 History of Present Illness: pt with L ankle ORIF.         Prior [place] expects to be discharged to:: Private residence Living Arrangements: Alone Available Help at Discharge: Family;Available 24 hours/day Type of Home: House Home Access: Level entry Home Layout: One level Home Equipment: Walker - standard Additional Comments: pt's mother is coming to stay with her.   Prior Function Level of Independence: Independent Communication Communication: No difficulties    Cognition  Cognition Arousal/Alertness: Awake/alert Behavior During Therapy: WFL for tasks assessed/performed Overall Cognitive Status: Within Functional Limits for tasks assessed    Extremity/Trunk Assessment Upper Extremity Assessment Upper Extremity Assessment: Defer to OT evaluation Lower Extremity Assessment Lower Extremity Assessment: LLE deficits/detail LLE Deficits / Details: Hip/knee WFL, ankle/foot casted.   LLE: Unable to fully assess due to immobilization   Balance Balance Overall balance assessment: Needs assistance Standing balance support: Bilateral upper extremity supported Standing balance-Leahy Scale: Poor  End of Session PT - End of Session Equipment Utilized During Treatment: Gait belt Activity Tolerance: Patient tolerated treatment well Patient left: in chair;with call bell/phone within reach Nurse Communication: Mobility status (pt washing.  )   GP Functional Assessment Tool Used: Clinical Judgement Functional Limitation:  Mobility: Walking and moving around Mobility: Walking and Moving Around Current Status [phone]): At least 1 percent but less than 20 percent impaired, limited or restricted Mobility: Walking and Moving Around Goal Status [phone]): 0 percent impaired, limited or restricted   Catarina Hartshorn, [place] 03/01/2013, 9:09 AM

## 2013-03-01 NOTE — Progress Notes (Signed)
Subjective: 1 Day Post-Op Procedure(s) (LRB): OPEN REDUCTION INTERNAL FIXATION (ORIF)  TRIMALEALLOR ANKLE FRACTURE (Left) Patient reports pain as mild.    Objective: Vital signs in last 24 hours: Temp:  [97.7 F (36.5 C)-99.7 F (37.6 C)] 99.7 F (37.6 C) (02/27 0554) Pulse Rate:  [74-98] 87 (02/27 0554) Resp:  [15-20] 20 (02/27 0554) BP: (110-132)/(52-83) 132/83 mmHg (02/27 0554) SpO2:  [94 %-100 %] 98 % (02/27 0554)  Intake/Output from previous day: 02/26 0701 - 02/27 0700 In: 1140 [P.O.:240; I.V.:900] Out: 20 [Blood:20] Intake/Output this shift:     Recent Labs  02/28/13 0052  HGB 12.8    Recent Labs  02/28/13 0052  WBC 13.5*  RBC 4.44  HCT 37.9  PLT 341    Recent Labs  02/28/13 0052  NA 140  K 3.9  CL 103  CO2 25  BUN 9  CREATININE 0.66  GLUCOSE 107*  CALCIUM 9.6   No results found for this basename: LABPT, INR,  in the last 72 hours  Neurologically intact  Assessment/Plan: 1 Day Post-Op Procedure(s) (LRB): OPEN REDUCTION INTERNAL FIXATION (ORIF)  TRIMALEALLOR ANKLE FRACTURE (Left) Up with therapy.   Just made it to bathroom today. Per therapy she needs more practice. Plan discharge Saturday. Rx on chart  Modene Andy C 03/01/2013, 9:29 AM

## 2013-03-01 NOTE — Discharge Instructions (Signed)
Cast or Splint Care  Casts and splints support injured limbs and keep bones from moving while they heal. It is important to care for your cast or splint at home.   HOME CARE INSTRUCTIONS   Keep the cast or splint uncovered during the drying period. It can take 24 to 48 hours to dry if it is made of plaster. A fiberglass cast will dry in less than 1 hour.   Do not rest the cast on anything harder than a pillow for the first 24 hours.   Do not put weight on your injured limb or apply pressure to the cast until your health care provider gives you permission.   Keep the cast or splint dry. Wet casts or splints can lose their shape and may not support the limb as well. A wet cast that has lost its shape can also create harmful pressure on your skin when it dries. Also, wet skin can become infected.   Cover the cast or splint with a plastic bag when bathing or when out in the rain or snow. If the cast is on the trunk of the body, take sponge baths until the cast is removed.   If your cast does become wet, dry it with a towel or a blow dryer on the cool setting only.   Keep your cast or splint clean. Soiled casts may be wiped with a moistened cloth.   Do not place any hard or soft foreign objects under your cast or splint, such as cotton, toilet paper, lotion, or powder.   Do not try to scratch the skin under the cast with any object. The object could get stuck inside the cast. Also, scratching could lead to an infection. If itching is a problem, use a blow dryer on a cool setting to relieve discomfort.   Do not trim or cut your cast or remove padding from inside of it.   Exercise all joints next to the injury that are not immobilized by the cast or splint. For example, if you have a long leg cast, exercise the hip joint and toes. If you have an arm cast or splint, exercise the shoulder, elbow, thumb, and fingers.   Elevate your injured arm or leg on 1 or 2 pillows for the first 1 to 3 days to decrease  swelling and pain.It is best if you can comfortably elevate your cast so it is higher than your heart.  SEEK MEDICAL CARE IF:    Your cast or splint cracks.   Your cast or splint is too tight or too loose.   You have unbearable itching inside the cast.   Your cast becomes wet or develops a soft spot or area.   You have a bad smell coming from inside your cast.   You get an object stuck under your cast.   Your skin around the cast becomes red or raw.   You have new pain or worsening pain after the cast has been applied.  SEEK IMMEDIATE MEDICAL CARE IF:    You have fluid leaking through the cast.   You are unable to move your fingers or toes.   You have discolored (blue or white), cool, painful, or very swollen fingers or toes beyond the cast.   You have tingling or numbness around the injured area.   You have severe pain or pressure under the cast.   You have any difficulty with your breathing or have shortness of breath.   You have chest 

## 2013-03-01 NOTE — Evaluation (Signed)
Occupational Therapy Evaluation Patient Details Name: [name] [name] MRN: [id number] DOB: Sep 05, 1970 Today's Date: 03/01/2013 Time: 2536-6440 OT Time Calculation (min): 43 min  OT Assessment / Plan / Recommendation History of present illness pt with L ankle ORIF.     Clinical Impression   Patient evaluated by Occupational Therapy with no further acute OT needs identified. All education has been completed and the patient has no further questions.  See below for any follow-up Occupational Therapy or equipment needs. OT is signing off. Thank you for this referral.     OT Assessment  Patient does not need any further OT services    Follow Up Recommendations  No OT follow up;Supervision - Intermittent    Barriers to Discharge      Equipment Recommendations  Tub/shower bench    Recommendations for Other Services    Frequency       Precautions / Restrictions Precautions Precautions: Fall Restrictions Weight Bearing Restrictions: Yes LLE Weight Bearing: Touchdown weight bearing   Pertinent Vitals/Pain     ADL  Eating/Feeding: Independent Where Assessed - Eating/Feeding: Bed level;Chair Grooming: Wash/dry hands;Wash/dry face;Teeth care;Brushing hair;Set up Where Assessed - Grooming: Supported sitting;Unsupported sitting Upper Body Bathing: Set up Where Assessed - Upper Body Bathing: Unsupported sitting Lower Body Bathing: Min guard Where Assessed - Lower Body Bathing: Unsupported sit to stand Upper Body Dressing: Set up Where Assessed - Upper Body Dressing: Unsupported sitting Lower Body Dressing: Min guard Where Assessed - Lower Body Dressing: Unsupported sit to stand Toilet Transfer: Min Psychiatric nurse Method: Sit to stand;Stand pivot Science writer: Comfort height toilet Toileting - Clothing Manipulation and Hygiene: Min guard Where Assessed - Best boy and Hygiene: Sit to stand from 3-in-1 or toilet Tub/Shower Transfer: Minimal  assistance Tub/Shower Transfer Method: Therapist, art: IT consultant Used: Rolling walker Transfers/Ambulation Related to ADLs: min guard assist ADL Comments: Pt instructed in use of Tub transfer bench.  Requires assist for Lt. LE.  Pt and mother instructed in how to cover Lt. LE with bag and tape for shower or to purchase a cast cover.  Also simulated toilet transfer onto low surface using grab bar and pt able to return demo.     OT Diagnosis:    OT Problem List:   OT Treatment Interventions:     OT Goals(Current goals can be found in the care plan section)    Visit Information  Last OT Received On: 03/01/13 Assistance Needed: +1 History of Present Illness: pt with L ankle ORIF.         Prior [place] expects to be discharged to:: Private residence Living Arrangements: Alone Available Help at Discharge: Family;Available 24 hours/day Type of Home: House Home Access: Level entry Home Layout: One level Home Equipment: Walker - standard Additional Comments: pt's mother is coming to stay with her.   Prior Function Level of Independence: Independent Communication Communication: No difficulties         Vision/Perception     Cognition  Cognition Arousal/Alertness: Awake/alert Behavior During Therapy: WFL for tasks assessed/performed Overall Cognitive Status: Within Functional Limits for tasks assessed    Extremity/Trunk Assessment Upper Extremity Assessment Upper Extremity Assessment: Overall WFL for tasks assessed Lower Extremity Assessment Lower Extremity Assessment: Defer to PT evaluation Cervical / Trunk Assessment Cervical / Trunk Assessment: Normal     Mobility Bed Mobility Overal bed mobility: Needs Assistance Bed Mobility: Supine to Sit;Sit to Supine Supine to sit: Min assist  General bed mobility comments: assist for Lt. LE Transfers Overall transfer level: Needs  assistance Equipment used: Rolling walker (2 wheeled) Transfers: Sit to/from [facility] Sit to Stand: Min guard Stand pivot transfers: Min guard     Exercise     Balance     End of Session OT - End of Session Equipment Utilized During Treatment: Rolling walker Activity Tolerance: Patient tolerated treatment well Patient left: in bed;with call bell/phone within reach;with family/visitor present  GO Functional Limitation: Self care Self Care Current Status (O1751): At least 1 percent but less than 20 percent impaired, limited or restricted Self Care Goal Status (W2585): At least 1 percent but less than 20 percent impaired, limited or restricted Self Care Discharge Status [phone]): At least 1 percent but less than 20 percent impaired, limited or restricted   Meghin Thivierge M 03/01/2013, 7:10 PM

## 2013-03-01 NOTE — Op Note (Signed)
NAMEMALIKA, [name] [name]                 ACCOUNT NO.:  [id number]  MEDICAL RECORD NO.:  [id number]  LOCATION:  [id number]                        FACILITY:  [facility]  PHYSICIAN:  Aisea Bouldin C. Lorin Mercy, M.D.    DATE OF BIRTH:  11/17/1970  DATE OF PROCEDURE: DATE OF DISCHARGE:                              OPERATIVE REPORT   PREOPERATIVE DIAGNOSIS:  [name].  POSTOPERATIVE DIAGNOSIS:  [name].  PROCEDURE:  Open reduction and internal fixation, left trimalleolar ankle fracture with fixation of all 3 malleoli fractures.  SURGEON:  Mahkayla Preece C. Lorin Mercy, M.D.  ANESTHESIA:  General plus preoperative regional block and 10 mL Marcaine local.  ESTIMATED BLOOD LOSS:  Minimal.  TOURNIQUET TIME:  39 minutes.  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  It was reduced in the emergency room with conscious sedation, and now she is brought to the operating room for stabilization and fixation of the fracture dislocation.  This was a closed injury, left trimalleolar.  DESCRIPTION OF PROCEDURE:  After induction of general anesthesia, 2 g Ancef prophylaxis for weight 115 kg, DuraPrep was used, proximal thigh tourniquet was used.  Time-out procedure was performed while DuraPrep was drying.  Usual extremity sheets and drapes were applied. Sterile glove over the toes.  Time-out procedure was completed.  Sterile skin marker was used for C-shaped incision anterior to the medial malleolus and then lateral incision in line with the fibula longitudinally.  Leg was wrapped with an Esmarch, elevated, tourniquet inflated to 350. Medial side was opened first, the fracture was irrigated.  A small chip piece of articular cartilage, chondral injury was removed.  Nothing __________ reduction.  Incision was made laterally, subperiosteal dissection.  There was  comminution of the fibula, small anterior butterfly piece at the fracture, and fracture of fibula was slightly less oblique than typical.  Syndesmosis ligaments appeared to be solid. Seven-hole 1/3 tubular plate was applied.  Distal cancellous unicortical screws were placed.  Tip of the plate had been bent due to the distal position of the fracture and 12 x 14 mm screws were placed cancellous. Approximately bicortical screws were placed x4, interfrag screw was placed, checked under fluoroscopy intermittently.  Medial side was then reduced.  Two 40 mm cancellous partially threaded lag screws were used to anatomically reduce the fracture and compress the fracture site under fluoroscopy which had been draped.  The posterior malleolus was held in a reduced position and interfrag 45 mm cortical screw was placed lagging the fracture fragment compressing it.  Screws set out at 2 mm posteriorly.  This was directly in the midline.  AP, lateral, mortise views were checked, ankle was flexed, dorsiflexed, syndesmosis was checked, there was good reduction.  Medial clear space was reduced.  No instability and no syndesmotic screw was needed.  After irrigation with saline solution, tourniquet deflated after 39 minutes, hemostasis obtained.  2-0 Vicryl in the subcutaneous tissue, skin with staple closure.  Xeroform, Marcaine infiltration, 4x4s, ABDs, short-leg splint 5 x 30, Webril, and Ace wrap.  The patient tolerated the procedure well  and transferred to recovery room in stable condition.     [name] [name] C. Lorin Mercy, M.D.     MCY/MEDQ  D:  02/28/2013  T:  03/01/2013  Job:  [id number]

## 2013-03-02 NOTE — Progress Notes (Signed)
Subjective: 2 Days Post-Op Procedure(s) (LRB): OPEN REDUCTION INTERNAL FIXATION (ORIF)  TRIMALEALLOR ANKLE FRACTURE (Left) Patient reports pain as mild and moderate.    Objective: Vital signs in last 24 hours: Temp:  [97.6 F (36.4 C)-99 F (37.2 C)] 98.3 F (36.8 C) (02/28 0947) Pulse Rate:  [85-99] 95 (02/28 0947) Resp:  [20] 20 (02/28 0947) BP: (93-115)/(59-68) 97/59 mmHg (02/28 0947) SpO2:  [97 %-98 %] 98 % (02/28 0947)  Intake/Output from previous day:   Intake/Output this shift:     Recent Labs  02/28/13 0052  HGB 12.8    Recent Labs  02/28/13 0052  WBC 13.5*  RBC 4.44  HCT 37.9  PLT 341    Recent Labs  02/28/13 0052  NA 140  K 3.9  CL 103  CO2 25  BUN 9  CREATININE 0.66  GLUCOSE 107*  CALCIUM 9.6   No results found for this basename: LABPT, INR,  in the last 72 hours  Sensation intact distally Dorsiflexion/Plantar flexion intact No calf pain  Assessment/Plan: 2 Days Post-Op Procedure(s) (LRB): OPEN REDUCTION INTERNAL FIXATION (ORIF)  TRIMALEALLOR ANKLE FRACTURE (Left) Discharge home today  [facility] 03/02/2013, 12:11 PM

## 2013-03-02 NOTE — Progress Notes (Signed)
Physical Therapy Treatment Patient Details Name: Machaela Caterino MRN: [id number] DOB: Aug 20, 1970 Today's Date: 03/02/2013 Time: 2820-6015 PT Time Calculation (min): 26 min  PT Assessment / Plan / Recommendation  History of Present Illness pt with L ankle ORIF.     PT Comments   Patient continues to be highly motivated and progressing well with mobility. Recommend WC at this time with elevating L leg rest for long distance mobility. Patient planning to hopefully DC home today where her mother will be staying with her  Follow Up Recommendations  No PT follow up;Supervision for mobility/OOB     Does the patient have the potential to tolerate intense rehabilitation     Barriers to Discharge        Equipment Recommendations  Rolling walker with 5" wheels;3in1 (PT);Wheelchair (measurements PT);Wheelchair cushion (measurements PT)    Recommendations for Other Services    Frequency Min 5X/week   Progress towards PT Goals Progress towards PT goals: Progressing toward goals  Plan Current plan remains appropriate    Precautions / Restrictions Precautions Precautions: Fall Restrictions Weight Bearing Restrictions: Yes LLE Weight Bearing: Touchdown weight bearing   Pertinent Vitals/Pain 4/10 L ankle pain    Mobility  Bed Mobility Overal bed mobility: Needs Assistance Supine to sit: Supervision General bed mobility comments: patient demos good technique with increased effort Transfers Overall transfer level: Needs assistance Equipment used: Rolling walker (2 wheeled) Sit to Stand: Min guard Stand pivot transfers: Min guard General transfer comment: MinGuard for safety. Demos good technique Ambulation/Gait Ambulation/Gait assistance: Physicist, medical (Feet): 40 Feet Assistive device: Rolling walker (2 wheeled) Gait Pattern/deviations: Step-to pattern General Gait Details: pt maintains NWBing with L LE.      Exercises     PT Diagnosis:    PT Problem List:   PT  Treatment Interventions:     PT Goals (current goals can now be found in the care plan section)    Visit Information  Last PT Received On: 03/02/13 Assistance Needed: +1 History of Present Illness: pt with L ankle ORIF.      Subjective Data      Cognition  Cognition Arousal/Alertness: Awake/alert Behavior During Therapy: WFL for tasks assessed/performed Overall Cognitive Status: Within Functional Limits for tasks assessed    Balance     End of Session PT - End of Session Equipment Utilized During Treatment: Gait belt Activity Tolerance: Patient tolerated treatment well Patient left: in chair;with call bell/phone within reach Nurse Communication: Mobility status   GP     Jacqualyn Posey 03/02/2013, 8:49 AM 03/02/2013 Jacqualyn Posey PTA [phone] pager [phone] office

## 2013-03-04 NOTE — Progress Notes (Signed)
Agree with PTA.    Armanie Martine, PT [phone]  

## 2013-03-13 DIAGNOSIS — M542 Cervicalgia: Secondary | ICD-10-CM | POA: Insufficient documentation

## 2013-03-18 NOTE — Discharge Summary (Signed)
Physician Discharge Summary  Patient ID: [name] [name] MRN: [id number] DOB/AGE: Dec 03, 1970 43 y.o.  Admit date: 02/27/2013 Discharge date: 03/02/2013  Admission Diagnoses:  Trimalleolar fracture of ankle, closed left   Discharge Diagnoses:  Principal Problem:   Trimalleolar fracture of ankle, closed left   Past Medical History  Diagnosis Date  . Melanoma     43 yo on back    Surgeries: Procedure(s): OPEN REDUCTION INTERNAL FIXATION (ORIF)  TRIMALEALLOR ANKLE FRACTURE on 02/27/2013 - 03/20/2013   Consultants (if any):    Discharged Condition: Improved  Hospital Course: [name] [name] is an 43 y.o. female who was admitted 02/27/2013 with a diagnosis of Trimalleolar fracture of ankle, closed and went to the operating room on 02/27/2013 - March 20, 2013 and underwent the above named procedures.    She was given perioperative antibiotics:      Anti-infectives   Start     Dose/Rate Route Frequency Ordered Stop   2013/03/20 1600  ceFAZolin (ANCEF) IVPB 2 g/50 mL premix     2 g 100 mL/hr over 30 Minutes Intravenous  Once 03/20/13 1535 March 20, 2013 2219   03/20/13 1330  ceFAZolin (ANCEF) IVPB 2 g/50 mL premix  Status:  Discontinued     2 g 100 mL/hr over 30 Minutes Intravenous  Once 2013/03/20 1355 March 20, 2013 1528    .  She was given sequential compression devices, early ambulation and aspirin for DVT prophylaxis.  She benefited maximally from the hospital stay and there were no complications.    Recent vital signs:  Filed Vitals:   03/02/13 1409  BP: 106/65  Pulse: 93  Temp: 97.8 F (36.6 C)  Resp: 20    Recent laboratory studies:  Lab Results  Component Value Date   HGB 12.8 03/20/2013   Lab Results  Component Value Date   WBC 13.5* 20-Mar-2013   PLT 341 03-20-13   No results found for this basename: INR   Lab Results  Component Value Date   NA 140 03/20/2013   K 3.9 03-20-13   CL 103 20-Mar-2013   CO2 25 03-20-13   BUN 9 2013-03-20   CREATININE 0.66 March 20, 2013   GLUCOSE  107* 03-20-13    Discharge Medications:     Medication List         aspirin EC 81 MG tablet  Take 81 mg by mouth daily.     cetirizine 10 MG tablet  Commonly known as:  ZYRTEC  Take 10 mg by mouth daily.     omeprazole 40 MG capsule  Commonly known as:  PRILOSEC  Take 40 mg by mouth daily.     oxyCODONE-acetaminophen 5-325 MG per tablet  Commonly known as:  ROXICET  Take 2 tablets by mouth every 4 (four) hours as needed for severe pain.        Diagnostic Studies: Dg Ankle Complete Left  20-Mar-2013   CLINICAL DATA:  Open reduction and internal fixation of a trimalleolar left ankle fracture.  EXAM: LEFT ANKLE COMPLETE - 3+ VIEW; DG C-ARM 1-60 MIN; fluoro time 24 seconds  COMPARISON:  Preoperative three view left ankle series  FINDINGS: Three fluoro spot films reveal the patient to have undergone ORIF for the trimalleolar fracture dislocation. The ankle joint mortise is now anatomic. The medial andposterior malleolar fractures have been reduced with cortical screws. A sideplate is present through the distal fibular metadiaphyseal fracture.  IMPRESSION: The patient has undergone successful ORIF of a trimalleolar fracture dislocation. There is no evidence of immediate postprocedure complication.   Electronically  Signed   By: David  Martinique   On: 02/28/2013 14:23   Dg Ankle Complete Left  02/28/2013   CLINICAL DATA:  Fall  EXAM: LEFT ANKLE COMPLETE - 3+ VIEW  COMPARISON:  None.  FINDINGS: There is an acute oblique fracture through the distal left fibula with intra-articular extension. There is slight lateral displacement at this fracture. Additional fracture of the medial malleolus with slight medial displacement is for present. There is an additional fracture of the posterior malleolus with posterior displacement. There is asymmetric widening of the medial ankle mortise. Talar dome is intact. Calcaneus is intact. Base of fifth metatarsal is intact. Diffuse soft tissue swelling present about  the ankle. Calcific density present at the posterior aspect of the calcaneus at the insertion site of the Achilles tendon is likely chronic in nature.  IMPRESSION: Acute trimalleolar fracture as above with associated widening of the ankle mortise.   Electronically Signed   By: Jeannine Boga M.D.   On: 02/28/2013 00:40   Dg C-arm 1-60 Min  02/28/2013   CLINICAL DATA:  Open reduction and internal fixation of a trimalleolar left ankle fracture.  EXAM: LEFT ANKLE COMPLETE - 3+ VIEW; DG C-ARM 1-60 MIN; fluoro time 24 seconds  COMPARISON:  Preoperative three view left ankle series  FINDINGS: Three fluoro spot films reveal the patient to have undergone ORIF for the trimalleolar fracture dislocation. The ankle joint mortise is now anatomic. The medial andposterior malleolar fractures have been reduced with cortical screws. A sideplate is present through the distal fibular metadiaphyseal fracture.  IMPRESSION: The patient has undergone successful ORIF of a trimalleolar fracture dislocation. There is no evidence of immediate postprocedure complication.   Electronically Signed   By: David  Martinique   On: 02/28/2013 14:23    Disposition: 01-Home or Self Care DISCHARGE INSTRUCTION: Cast care reference sheet given to patient.  Non weight bearing with walker.  Keep splint dry and clean.  Elevation above heart at rest. Move toes frequently.   Follow-up Information   Follow up with YATES,MARK C, MD In 1 week.   Specialty:  Orthopedic Surgery   Contact information:   [place] [address] [phone]        Signed: Epimenio Foot 03/18/2013, 10:21 AM

## 2013-05-22 ENCOUNTER — Other Ambulatory Visit: Payer: Self-pay | Admitting: Orthopaedic Surgery

## 2013-05-22 DIAGNOSIS — R52 Pain, unspecified: Secondary | ICD-10-CM

## 2013-05-23 ENCOUNTER — Ambulatory Visit
Admission: RE | Admit: 2013-05-23 | Discharge: 2013-05-23 | Disposition: A | Discharge status: Home / Self Care | Payer: BC Managed Care – PPO | Source: Ambulatory Visit | Attending: Orthopaedic Surgery | Admitting: Orthopaedic Surgery

## 2013-05-23 ENCOUNTER — Encounter (INDEPENDENT_AMBULATORY_CARE_PROVIDER_SITE_OTHER): Payer: Self-pay

## 2013-05-23 DIAGNOSIS — R52 Pain, unspecified: Secondary | ICD-10-CM

## 2014-01-20 DIAGNOSIS — J00 Acute nasopharyngitis [common cold]: Secondary | ICD-10-CM | POA: Insufficient documentation

## 2014-01-30 DIAGNOSIS — M25562 Pain in left knee: Secondary | ICD-10-CM | POA: Insufficient documentation

## 2014-01-30 DIAGNOSIS — M170 Bilateral primary osteoarthritis of knee: Secondary | ICD-10-CM | POA: Insufficient documentation

## 2014-07-04 DIAGNOSIS — B354 Tinea corporis: Secondary | ICD-10-CM | POA: Insufficient documentation

## 2014-10-09 DIAGNOSIS — Z967 Presence of other bone and tendon implants: Secondary | ICD-10-CM | POA: Insufficient documentation

## 2014-10-09 DIAGNOSIS — S82892D Other fracture of left lower leg, subsequent encounter for closed fracture with routine healing: Secondary | ICD-10-CM | POA: Insufficient documentation

## 2015-07-10 ENCOUNTER — Emergency Department (HOSPITAL_COMMUNITY)
Admission: EM | Admit: 2015-07-10 | Discharge: 2015-07-10 | Disposition: A | Discharge status: Home / Self Care | Payer: BC Managed Care – PPO | Attending: Emergency Medicine | Admitting: Emergency Medicine

## 2015-07-10 ENCOUNTER — Emergency Department (HOSPITAL_COMMUNITY): Payer: BC Managed Care – PPO

## 2015-07-10 ENCOUNTER — Encounter (HOSPITAL_COMMUNITY): Payer: Self-pay | Admitting: *Deleted

## 2015-07-10 DIAGNOSIS — Z91040 Latex allergy status: Secondary | ICD-10-CM | POA: Insufficient documentation

## 2015-07-10 DIAGNOSIS — Z7982 Long term (current) use of aspirin: Secondary | ICD-10-CM | POA: Diagnosis not present

## 2015-07-10 DIAGNOSIS — R002 Palpitations: Secondary | ICD-10-CM

## 2015-07-10 DIAGNOSIS — R000 Tachycardia, unspecified: Secondary | ICD-10-CM | POA: Insufficient documentation

## 2015-07-10 DIAGNOSIS — R079 Chest pain, unspecified: Secondary | ICD-10-CM

## 2015-07-10 LAB — BASIC METABOLIC PANEL
ANION GAP: 11 (ref 5–15)
BUN: 14 mg/dL (ref 6–20)
CALCIUM: 10 mg/dL (ref 8.9–10.3)
CHLORIDE: 107 mmol/L (ref 101–111)
CO2: 19 mmol/L — AB (ref 22–32)
Creatinine, Ser: 0.72 mg/dL (ref 0.44–1.00)
GFR calc Af Amer: >60 mL/min (ref >60)
GFR calc non Af Amer: >60 mL/min (ref >60)
GLUCOSE: 101 mg/dL — AB (ref 65–99)
Potassium: 4 mmol/L (ref 3.5–5.1)
Sodium: 137 mmol/L (ref 135–145)

## 2015-07-10 LAB — I-STAT BETA HCG BLOOD, ED (MC, WL, AP ONLY): I-stat hCG, quantitative: <5 m[IU]/mL (ref <5)

## 2015-07-10 LAB — CBC
HEMATOCRIT: 38.9 % (ref 36.0–46.0)
HEMOGLOBIN: 12.6 g/dL (ref 12.0–15.0)
MCH: 26.6 pg (ref 26.0–34.0)
MCHC: 32.4 g/dL (ref 30.0–36.0)
MCV: 82.2 fL (ref 78.0–100.0)
Platelets: 437 10*3/uL — ABNORMAL HIGH (ref 150–400)
RBC: 4.73 MIL/uL (ref 3.87–5.11)
RDW: 14.3 % (ref 11.5–15.5)
WBC: 6.8 10*3/uL (ref 4.0–10.5)

## 2015-07-10 LAB — I-STAT TROPONIN, ED: TROPONIN I, POC: 0 ng/mL (ref 0.00–0.08)

## 2015-07-10 MED ORDER — IOPAMIDOL (ISOVUE-370) INJECTION 76%
INTRAVENOUS | Status: AC
  Administered 2015-07-10: 100 mL
  Filled 2015-07-10: qty 100

## 2015-07-10 NOTE — ED Notes (Signed)
Pt reports mid chest pains that started on Monday, following recent illness. Pain radiated into left arm. Reports recently feeling lightheaded. Went to pcp office today and sent here for eval.
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
## 2015-07-10 NOTE — ED Notes (Signed)
Patient transported to CT 

## 2015-07-10 NOTE — Discharge Instructions (Signed)
1. Medications: usual home medications 2. Treatment: rest, drink plenty of fluids,  3. Follow Up: Please followup with your primary doctor in 2 days for discussion of your diagnoses and further evaluation after today's visit; if you do not have a primary care doctor use the resource guide provided to find one; Please return to the ER for chest pain that is exertional, radiates or is associated with nausea, vomiting, near syncope, sweating or other concerning symptoms

## 2015-08-06 DIAGNOSIS — L255 Unspecified contact dermatitis due to plants, except food: Secondary | ICD-10-CM | POA: Insufficient documentation

## 2015-08-10 DIAGNOSIS — T50905A Adverse effect of unspecified drugs, medicaments and biological substances, initial encounter: Secondary | ICD-10-CM | POA: Insufficient documentation

## 2015-08-15 ENCOUNTER — Emergency Department (HOSPITAL_COMMUNITY)
Admission: EM | Admit: 2015-08-15 | Discharge: 2015-08-15 | Disposition: A | Discharge status: Home / Self Care | Payer: BC Managed Care – PPO | Attending: Emergency Medicine | Admitting: Emergency Medicine

## 2015-08-15 ENCOUNTER — Encounter (HOSPITAL_COMMUNITY): Payer: Self-pay

## 2015-08-15 DIAGNOSIS — Z7982 Long term (current) use of aspirin: Secondary | ICD-10-CM | POA: Insufficient documentation

## 2015-08-15 DIAGNOSIS — L259 Unspecified contact dermatitis, unspecified cause: Secondary | ICD-10-CM

## 2015-08-15 DIAGNOSIS — L237 Allergic contact dermatitis due to plants, except food: Secondary | ICD-10-CM | POA: Diagnosis not present

## 2015-08-15 DIAGNOSIS — Z91040 Latex allergy status: Secondary | ICD-10-CM | POA: Insufficient documentation

## 2015-08-15 DIAGNOSIS — C4359 Malignant melanoma of other part of trunk: Secondary | ICD-10-CM | POA: Diagnosis not present

## 2015-08-15 MED ORDER — PREDNISONE 20 MG PO TABS: ORAL_TABLET | ORAL | 0 refills | Status: DC

## 2015-08-15 MED ORDER — ACETAMINOPHEN 500 MG PO TABS
1000.0000 mg | ORAL_TABLET | Freq: Once | ORAL | Status: AC
  Administered 2015-08-15: 1000 mg via ORAL
  Filled 2015-08-15: qty 2

## 2015-08-15 MED ORDER — PREDNISONE 20 MG PO TABS
60.0000 mg | ORAL_TABLET | Freq: Once | ORAL | Status: AC
  Administered 2015-08-15: 60 mg via ORAL
  Filled 2015-08-15: qty 3

## 2015-08-15 MED ORDER — FAMOTIDINE 20 MG PO TABS: 20.0000 mg | ORAL_TABLET | Freq: Two times a day (BID) | ORAL | 0 refills | Status: DC

## 2015-08-15 MED ORDER — FAMOTIDINE 20 MG PO TABS
20.0000 mg | ORAL_TABLET | Freq: Once | ORAL | Status: AC
  Administered 2015-08-15: 20 mg via ORAL
  Filled 2015-08-15: qty 1

## 2015-08-15 NOTE — Discharge Instructions (Signed)
Please read and follow all provided instructions.  Your diagnoses today include:  1. Contact dermatitis    Tests performed today include: Vital signs. See below for your results today.   Medications prescribed:  Take as prescribed   Home care instructions:  Follow any educational materials contained in this packet. Take Pepcid at home as listed on medication. That medication helps with the histamine reaction.   Follow-up instructions: Please follow-up with your Dermatologist for further evaluation of symptoms and treatment   Return instructions:  Please return to the Emergency Department if you do not get better, if you get worse, or new symptoms OR  - Fever (temperature greater than 101.26F)  - Bleeding that does not stop with holding pressure to the area    -Severe pain (please note that you may be more sore the day after your accident)  - Chest Pain  - Difficulty breathing  - Severe nausea or vomiting  - Inability to tolerate food and liquids  - Passing out  - Skin becoming red around your wounds  - Change in mental status (confusion or lethargy)  - New numbness or weakness    Please return if you have any other emergent concerns.  Additional Information:  Your vital signs today were: BP 105/89    Pulse (!) 128    Temp 98.3 F (36.8 C) (Oral)    Resp 20    Ht 5\' 8"  (1.727 m)    Wt 117.9 kg    SpO2 99%    BMI 39.53 kg/m  If your blood pressure (BP) was elevated above 135/85 this visit, please have this repeated by your doctor within one month. ---------------

## 2015-08-15 NOTE — ED Provider Notes (Signed)
Plains of pruritic rash for the past 2 weeks after being out in her yard. She denies any shortness of breath. She was treated for 3 days with prednisone. She states that prednisone made her feel somewhat hyper and rash worsened. No shortness of breath no fever. No other associated symptoms on exam alert and in no distress lungs clear auscultation heart regular rate and rhythm counted at 100 bpm by me abdomen obese skin pinkish rash with a few scabbed lesions in streaks are predominately on extremities and somewhat lessened on torso. No mucosal lesions. Believe the rash is secondary to contact dermatitis likely secondary to poison ivy or poison oak. I don't see any evidence of infection. Plan prednisone taper, (I strongly doubt allergy to prednisone. She states that prednisone made her feel somewhat hyper and rash got slightly worsened after taking prednisone, 3 day course )Pepcid, Benadryl. Referral back to her dermatologist   Orlie Dakin, MD 08/15/15 [phone]

## 2015-08-15 NOTE — ED Provider Notes (Signed)
[facility] DEPT Provider Note   CSN: [id number] Arrival date & time: 08/15/15  1717  First Provider Contact:  First MD Initiated Contact with Patient 08/15/15 1821     History   Chief Complaint No chief complaint on file.   HPI [name] [name] is a 45 y.o. female.  HPI  45 y.o. female presents to the Emergency Department today complaining of red itching/painful rash x 2 weeks. Pt states that two weeks ago she was gardening and believes she stepped into poison ivy. Had rash to BLE that was itching and painful. Proceeded to see urgent care and diagnosed with contact dermatitis. Given PO steroid taper x 1 week. During treatment, pt developed worsening rash that extended towards trunk and BUE. No airway compromise. No fevers. Talked to provider and said that she was probably allergic to prednisone and told to come to ED for further evaluation. No N/V/D. No CP/SOB/ABD pain. Does endorse pain "all over" as 10/10. Pt taking benadryl for itching. No other symptoms noted.   Past Medical History:  Diagnosis Date  . Melanoma ([place])    44 yo on back    Patient Active Problem List   Diagnosis Date Noted  . Trimalleolar fracture of ankle, closed 02/28/2013    Past Surgical History:  Procedure Laterality Date  . ORIF ANKLE FRACTURE Left 02/28/2013   Procedure: OPEN REDUCTION INTERNAL FIXATION (ORIF)  TRIMALEALLOR ANKLE FRACTURE;  Surgeon: Marybelle Killings, MD;  Location: [facility];  Service: Orthopedics;  Laterality: Left;  . TONSILLECTOMY      OB History    No data available       Home Medications    Prior to Admission medications   Medication Sig Start Date End Date Taking? Authorizing Provider  aspirin EC 81 MG tablet Take 81 mg by mouth daily.    Historical Provider, MD  cetirizine (ZYRTEC) 10 MG tablet Take 10 mg by mouth daily as needed for allergies.     Historical Provider, MD  naproxen sodium (ANAPROX) 220 MG tablet Take 220 mg by mouth 2 (two) times daily as needed (for pain).     Historical Provider, MD  omeprazole (PRILOSEC) 40 MG capsule Take 40 mg by mouth daily.    Historical Provider, MD  oxyCODONE-acetaminophen (ROXICET) 5-325 MG per tablet Take 2 tablets by mouth every 4 (four) hours as needed for severe pain. 03/01/13   Marybelle Killings, MD    Family History No family history on file.  Social History Social History  Substance Use Topics  . Smoking status: Never Smoker  . Smokeless tobacco: Never Used  . Alcohol use No     Allergies   Codeine; Latex; Prednisone; and Sulfa antibiotics   Review of Systems Review of Systems ROS reviewed and all are negative for acute change except as noted in the HPI.  Physical Exam Updated Vital Signs BP 105/89   Pulse (!) 128   Temp 98.3 F (36.8 C) (Oral)   Resp 20   Ht 5\' 8"  (1.727 m)   Wt 117.9 kg   SpO2 99%   BMI 39.53 kg/m   Physical Exam  Constitutional: She is oriented to person, place, and time. Vital signs are normal. She appears well-developed and well-nourished.  HENT:  Head: Normocephalic.  Right Ear: Hearing normal.  Left Ear: Hearing normal.  Eyes: Conjunctivae and EOM are normal. Pupils are equal, round, and reactive to light.  Neck: Normal range of motion. Neck supple.  Cardiovascular: Normal rate, regular rhythm, normal heart  sounds and intact distal pulses.   Pulmonary/Chest: Effort normal and breath sounds normal. No respiratory distress. She has no wheezes. She has no rales. She exhibits no tenderness.  Neurological: She is alert and oriented to person, place, and time.  Skin: Skin is warm and dry. Rash noted. Rash is maculopapular and urticarial.  Diffuse scaling rash noted on BLE and Hands. Erythematous urticarial rash noted on Trunk and chest. Diffusely note on extremities. Non purulent. Blanchable.  Psychiatric: She has a normal mood and affect. Her speech is normal and behavior is normal. Thought content normal.   ED Treatments / Results  Labs (all labs ordered are listed, but  only abnormal results are displayed) Labs Reviewed - No data to display  EKG  EKG Interpretation None       Radiology No results found.  Procedures Procedures (including critical care time)  Medications Ordered in ED Medications - No data to display   Initial Impression / Assessment and Plan / ED Course  I have reviewed the triage vital signs and the nursing notes.  Pertinent labs & imaging results that were available during my care of the patient were reviewed by me and considered in my medical decision making (see chart for details).  Clinical Course     Final Clinical Impressions(s) / ED Diagnoses  I have reviewed the relevant previous healthcare records. I obtained HPI from historian. Patient discussed with supervising physician  ED Course:  Assessment: Pt is a 89yF who presents with diffuse rash s/p contact with posion ivy. Noted reaction with prednisone for treatment of contact dermatitis. On exam, pt in NAD. Nontoxic/nonseptic appearing. VSS. Afebrile. Lungs CTA. Heart RRR. Rash noted on BLUE/BUE and Trunk that is a mix of contact dermatitis as well as urticarial rash with minor wheals. Given Pepcid and prednisone in ED. Unlikely reaction to Prednisone. Pt seen and evaluated by Supervising physician. Likely unfinished/low dose prednisone with rebound reaction of contact dermatitis. Plan is to DC home with follow up to Dermatologist. At time of discharge, Patient is in no acute distress. Vital Signs are stable. Patient is able to ambulate. Patient able to tolerate PO.    Disposition/Plan:  DC Home Additional Verbal discharge instructions given and discussed with patient.  Pt Instructed to f/u with Dermatology in the next week for evaluation and treatment of symptoms. Return precautions given Pt acknowledges and agrees with plan  Supervising Physician Orlie Dakin, MD   Final diagnoses:  Contact dermatitis    New Prescriptions New Prescriptions   No  medications on file     Shary Decamp, PA-C 08/15/15 [place], MD 08/15/15 2258

## 2015-08-15 NOTE — ED Triage Notes (Signed)
Patient here with red, itchy rash with scabs all over body x 2 weeks. MD states its poison ivy and last week received cortisone shot and symptoms have worsened with bleeding to arms and legs.

## 2017-10-17 DIAGNOSIS — N390 Urinary tract infection, site not specified: Secondary | ICD-10-CM | POA: Insufficient documentation

## 2017-10-17 DIAGNOSIS — N393 Stress incontinence (female) (male): Secondary | ICD-10-CM | POA: Insufficient documentation

## 2017-10-30 ENCOUNTER — Ambulatory Visit: Payer: BC Managed Care – PPO | Admitting: Psychiatry

## 2017-10-30 DIAGNOSIS — F411 Generalized anxiety disorder: Secondary | ICD-10-CM

## 2017-10-30 NOTE — Progress Notes (Signed)
      [facility] Counselor/Therapist Progress Note   Patient ID: [name] [name], MRN: [id number]  Date: 10/30/2017  Timespent: 50 minutes   Treatment Type: Individual   Reported Symptoms: anxiety, stress   Mental Status Exam:    Appearance:   Casual     Behavior:  Appropriate  Motor:  Normal  Speech/Language:   Clear and Coherent  Affect:  Appropriate  Mood:  anxious  Thought process:  normal  Thought content:    WNL  Sensory/Perceptual disturbances:    WNL  Orientation:  oriented to person, place, time/date and situation  Attention:  Good  Concentration:  Good  Memory:  WNL  Fund of knowledge:   Good  Insight:    Good  Judgment:   Good  Impulse Control:  Good     Risk Assessment: Danger to Self:  No Self-injurious Behavior: No Danger to Others: No Duty to Warn:no Physical Aggression / Violence:No  Access to Firearms a concern: No  Gang Involvement:No    Subjective: Client was very anxious today and stressed out.  She is having conflict with her live-in boyfriend over her cell phone use.  He feels it is disrespectful for her to be using it and not talking to him.  He also feels that she talks to men in in a way that is "too friendly".  Her complaint about him was that his friend from childhood who committed suicide, his girlfriend has been calling.  She feels the girlfriend is too dependent on her boyfriend. I discussed with the client that she needs to review the thinking errors handout along with her boyfriend.  Things such as mind reading, interpreting, fortune telling, is what is happening in their communication.  We also discussed using the clarification model of anger to clarify communication.  And also to set some ground rules between each other such as "I have goodwill in good intent toward you."  We discussed the need for forgiveness and to not go back to previous grudges.  The client agreed.  I referred the client to [name] [name], [facility] for them to do couples  work.   Interventions: Assertiveness/Communication, Solution-Oriented/Positive Psychology and Interpersonal   Diagnosis:   ICD-10-CM   1. Generalized anxiety disorder F41.1      Plan: Follow up with [name] [name], [facility] for couples work. [place], clarification model, thinking errors.    [name] [name], [place]

## 2017-11-27 ENCOUNTER — Ambulatory Visit: Payer: BC Managed Care – PPO | Admitting: Psychiatry

## 2017-11-27 ENCOUNTER — Encounter: Payer: Self-pay | Admitting: Psychiatry

## 2017-11-27 DIAGNOSIS — F411 Generalized anxiety disorder: Secondary | ICD-10-CM | POA: Diagnosis not present

## 2017-11-27 NOTE — Progress Notes (Signed)
[facility] Counselor/Therapist Progress Note  Patient ID: [name] [name], MRN: [id number],    Date: 11/27/2017  Time Spent: 55 minutes   Treatment Type: Individual Therapy  Reported Symptoms: Anxious Mood and Fatigue  Mental Status Exam:  Appearance:   Casual and Well Groomed     Behavior:  Appropriate  Motor:  Normal  Speech/Language:   Clear and Coherent  Affect:  Appropriate  Mood:  anxious and sad  Thought process:  normal  Thought content:    WNL  Sensory/Perceptual disturbances:    WNL  Orientation:  oriented to person, place, time/date and situation  Attention:  Good  Concentration:  Good  Memory:  WNL  Fund of knowledge:   Good  Insight:    Good  Judgment:   Good  Impulse Control:  Good   Risk Assessment: Danger to Self:  No Self-injurious Behavior: No Danger to Others: No Duty to Warn:no Physical Aggression / Violence:No  Access to Firearms a concern: No  Gang Involvement:No   Subjective: The client comes in very agitated today.  She reports that in 2015 she had an undiagnosed closed head injury.  She states, "I used to not be a weepy person but now I do it at the drop of a hat."  She states she is recently been referred to a neuropsychologist for testing.  The client is currently on Concerta to help her with her studying.  She is in her last class of her associates degree.  Her mother keeps telling her that she will fail and not graduate.  The client has close to a 4.0 GPA.  The community college was confident enough in her skills that they allowed her to walk anyway knowing she would complete the 2 courses by December for her degree.  Today I used eye movement with the client around the stress she feels with studying.  Her subjective units of distress was a 10+.  She felt it in her neck and her shoulders.  Her negative thought was, "I will fail."  She had a high level of anxiety.  As the client processed she saw that she had been very successful in her  previous courses and that her professor was helping her to pass this course.  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  I explained to the client that even if she was unable to complete this course successfully she could take an incomplete and no doubt finish it in the first part of next semester.  This clearly relieved the client.  Her positive cognition when this target was done was, "I can do this."  We also focused on the client's test anxiety.  Her negative thought was, "what if I do not do well."  As the client processed this she was able to significantly calm herself down.  Using the hand paddles she visualized being in the classroom and taking the test.  Jesus stood there with her and gave her support.  Her positive cognition at the end of this was, "I have got this."  The client also did a visualization of being on the beach.  Her cue word was tranquil.  She was able to use that to experience the deep relaxation.  She will use this before her test.  I also had the client practice deep breathing.  The client has agreed to walk briskly for 30 to 45 minutes.  I explained to the client that this would also act as a  natural antidepressant and antianxiety.  She agreed to do so.  Interventions: Mindfulness Meditation, Solution-Oriented/Positive Psychology, [employer] [facility] Desensitization and Reprocessing (EMDR) and Insight-Oriented  Diagnosis:   ICD-10-CM   1. Generalized anxiety disorder F41.1     Plan: Exercise, relaxation skills, self care, positive self talk.  [name] [name], [place]

## 2018-01-29 ENCOUNTER — Ambulatory Visit: Payer: BC Managed Care – PPO | Admitting: Psychiatry

## 2018-01-29 ENCOUNTER — Encounter: Payer: Self-pay | Admitting: Psychiatry

## 2018-01-29 DIAGNOSIS — F411 Generalized anxiety disorder: Secondary | ICD-10-CM

## 2018-01-29 NOTE — Progress Notes (Signed)
      [facility] Counselor/Therapist Progress Note  Patient ID: [name] [name], MRN: [id number],    Date: 01/29/2018  Time Spent: 50 minutes   Treatment Type: Individual Therapy  Reported Symptoms: Anxious Mood  Mental Status Exam:  Appearance:   Casual and Well Groomed     Behavior:  Appropriate  Motor:  Normal  Speech/Language:   Clear and Coherent  Affect:  Appropriate  Mood:  anxious  Thought process:  normal  Thought content:    WNL  Sensory/Perceptual disturbances:    WNL  Orientation:  oriented to person, place, time/date and situation  Attention:  Good  Concentration:  Good  Memory:  WNL  Fund of knowledge:   Good  Insight:    Good  Judgment:   Good  Impulse Control:  Good   Risk Assessment: Danger to Self:  No Self-injurious Behavior: No Danger to Others: No Duty to Warn:no Physical Aggression / Violence:No  Access to Firearms a concern: No  Gang Involvement:No   Subjective: The client states that she passed her classes and graduated.  Her brother who is estranged from her recently contacted her about having a baby.  She states, "he was mad at me about my grandfather's estate."  She also discussed how she has had to set some boundaries with a friend.  Today we used E MDR to focus on the clients boyfriend and the resentment she has towards him.  Her negative thought is, "he is trying to control me."  She feels resentment in her chest.  The client states that her boyfriend never wants her to use her phone when she is with him.  She felt like this was unreasonable.  I explained to the client that many people have an issue with people using the phone while they are trying to interact with him.  I said this was not an unreasonable expectation.  I also suggested that she use a string device to cast her phone onto the television screen so her boyfriend could actually see what she was looking at.  She thought this was a great idea and would agreed to do so.  Interventions:  Assertiveness/Communication, Solution-Oriented/Positive Psychology, Eye Movement Desensitization and Reprocessing (EMDR) and Insight-Oriented  Diagnosis:   ICD-10-CM   1. Generalized anxiety disorder F41.1     Plan: Assertiveness, boundaries, positive self talk.  Albertina Parr Tucker [place], [place]

## 2018-02-02 IMAGING — CT CT ANGIO CHEST
2 of 6 series · 18 of 36 positions shown · IV contrast (Omni 300)
Comparison: Radiographs earlier this day

CLINICAL DATA: Right-sided chest pain, palpitations and
tachycardia.

EXAM:
CT ANGIOGRAPHY CHEST WITH CONTRAST
TECHNIQUE: Multidetector CT imaging of the chest was performed using the
standard protocol during bolus administration of intravenous
contrast. Multiplanar CT image reconstructions and MIPs were
obtained to evaluate the vascular anatomy.
CONTRAST:  100 cc Isovue 370 IV

[Series 6: pe thins · axial · 0.78mm/px · z∈[-86,+145]mm · 17 of 261 slices shown]
[im 15/261  lung]
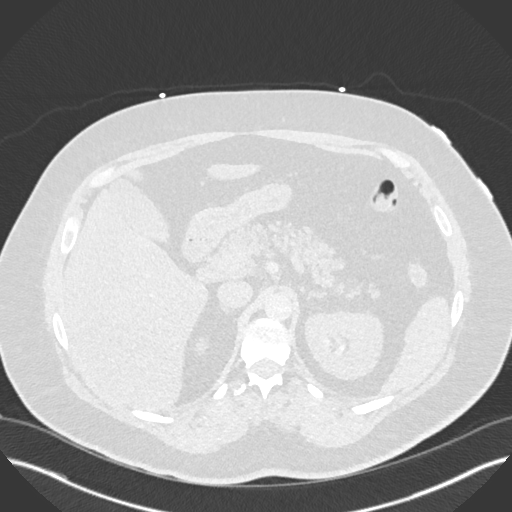
[im 29/261  mediastinal]
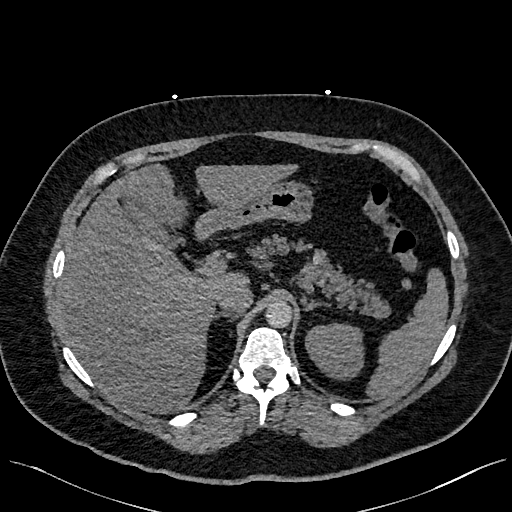
[im 44/261  lung]
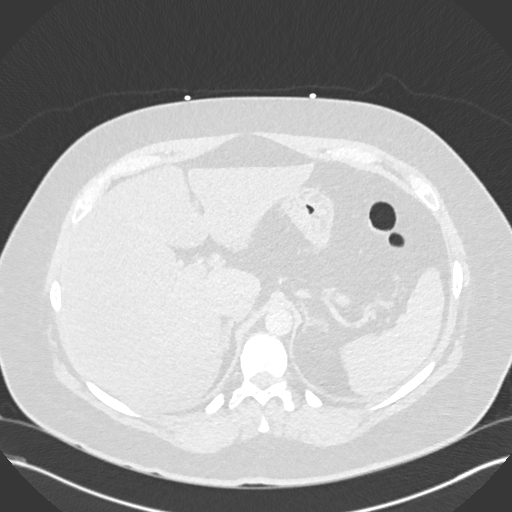
[im 58/261  mediastinal]
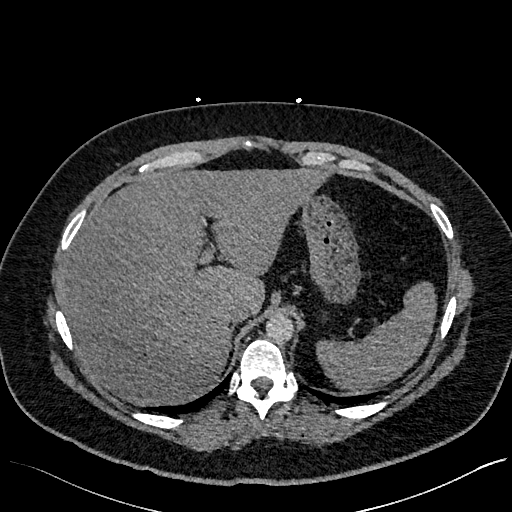
[im 73/261  lung]
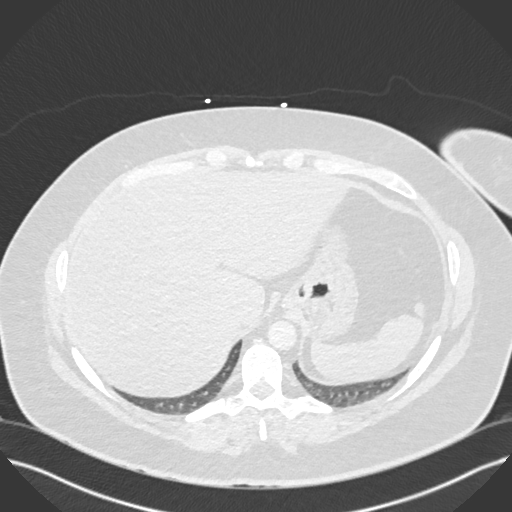
[im 87/261  mediastinal]
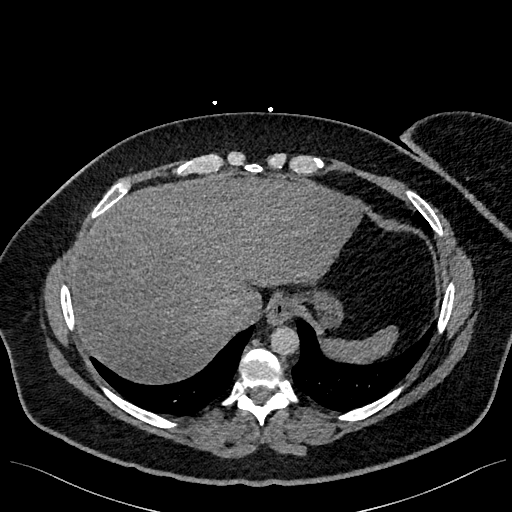
[im 102/261  lung]
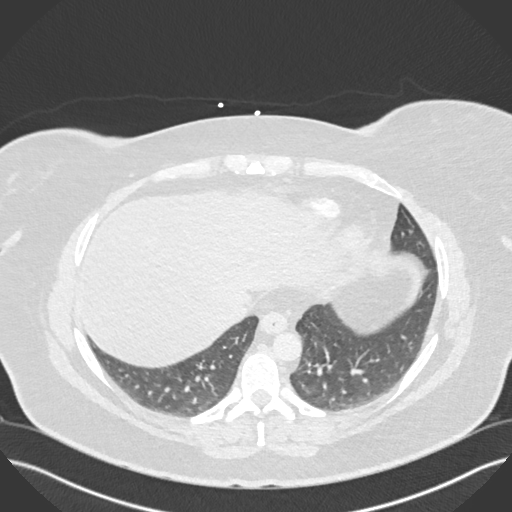
[im 116/261  mediastinal]
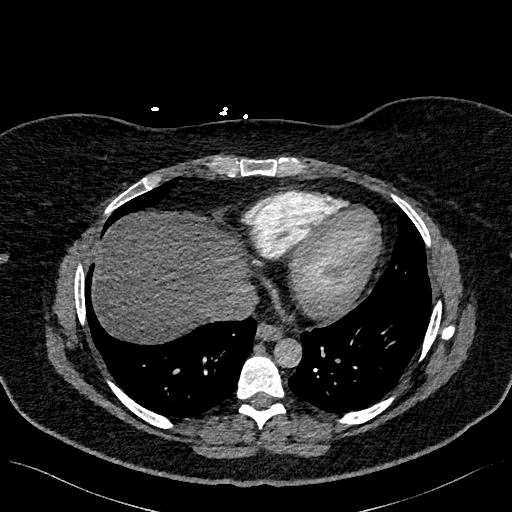
[im 131/261  lung]
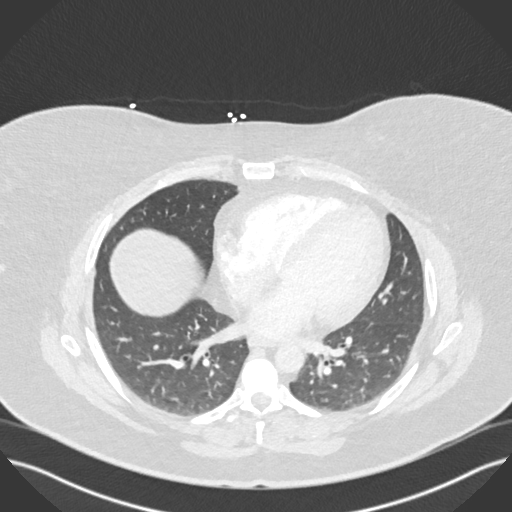
[im 145/261  mediastinal]
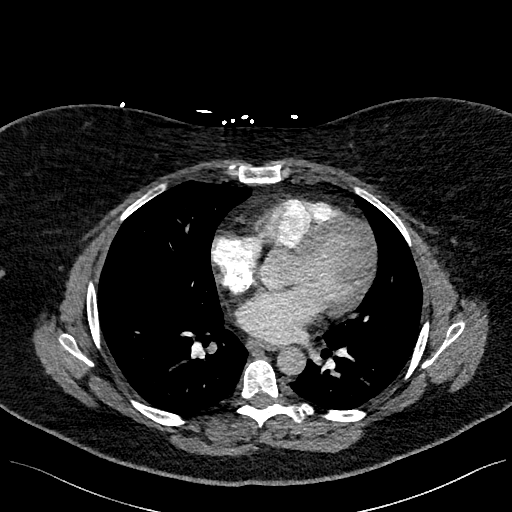
[im 159/261  lung]
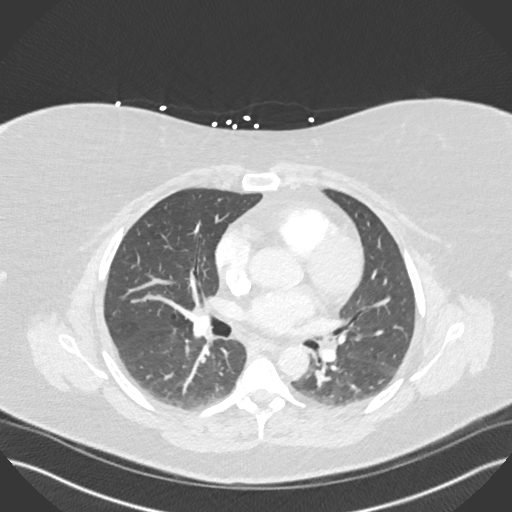
[im 174/261  mediastinal]
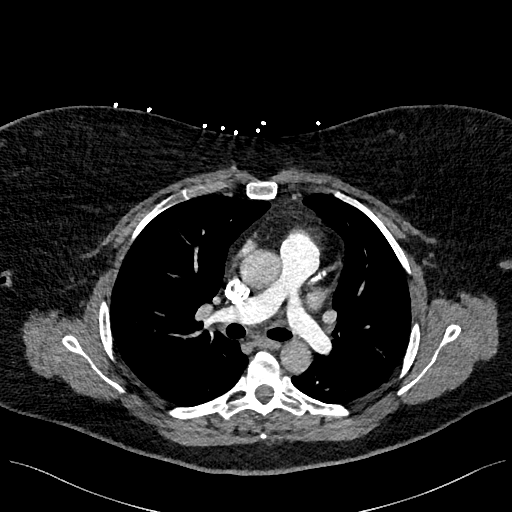
[im 188/261  lung]
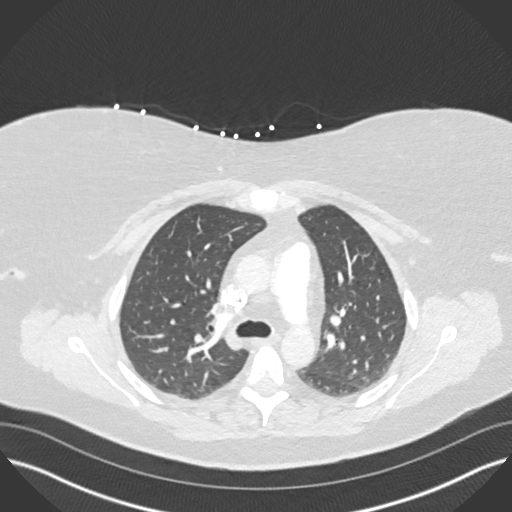
[im 203/261  mediastinal]
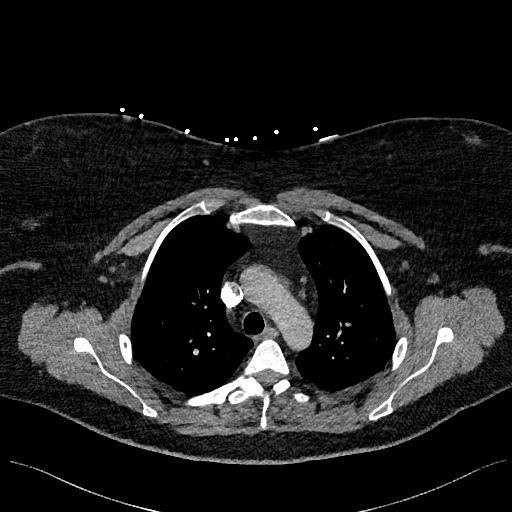
[im 217/261  lung]
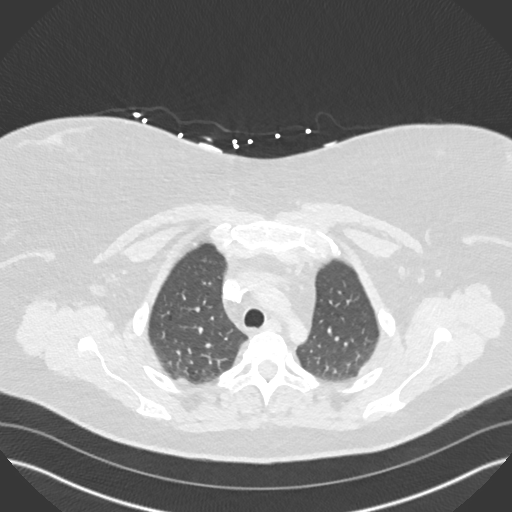
[im 232/261  mediastinal]
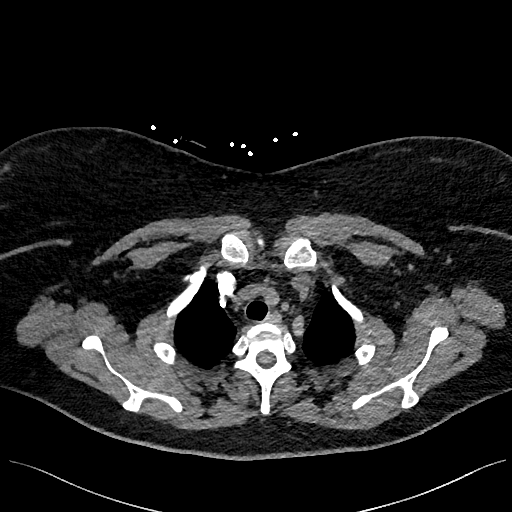
[im 246/261  lung]
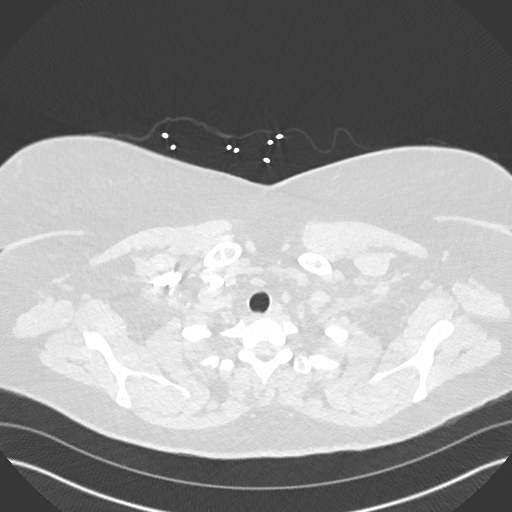

[Series 7: pe 2mm cor · coronal · 0.53mm/px · 1 of 131 slices shown]
[im 66/131  mediastinal]
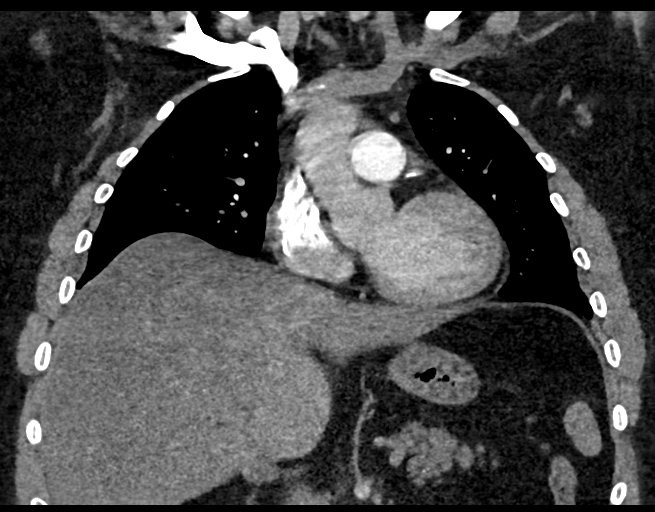

[18 of 36 positions shown; findings below may reference images not displayed]

FINDINGS: Cardiovascular: There are no filling defects within the pulmonary
arteries to suggest pulmonary embolus. Thoracic aorta is normal in
caliber without dissection. Coronary artery calcifications are seen.
Heart is normal in size.

Mediastinum/Nodes: No adenopathy or mediastinal mass. No pericardial
fluid. The esophagus is decompressed. Tiny hiatal hernia.

Lungs/Pleura: Trachea and mainstem bronchi are patent. The lungs are
clear. No consolidation or pulmonary edema. No pleural effusion.

Upper Abdomen: No acute abnormality. Liver appears prominent in
size.

Musculoskeletal: There are no acute or suspicious osseous
abnormalities.

Review of the MIP images confirms the above findings.
IMPRESSION: No pulmonary embolus.  No acute intrathoracic process.

Coronary artery calcifications.

## 2018-03-01 ENCOUNTER — Ambulatory Visit: Payer: BC Managed Care – PPO | Admitting: Psychiatry

## 2018-03-26 ENCOUNTER — Other Ambulatory Visit: Payer: Self-pay

## 2018-03-26 ENCOUNTER — Ambulatory Visit: Payer: BC Managed Care – PPO | Admitting: Psychiatry

## 2018-03-26 ENCOUNTER — Encounter: Payer: Self-pay | Admitting: Psychiatry

## 2018-03-26 DIAGNOSIS — F411 Generalized anxiety disorder: Secondary | ICD-10-CM | POA: Diagnosis not present

## 2018-03-26 NOTE — Progress Notes (Signed)
      [facility] Counselor/Therapist Progress Note  Patient ID: [name] [name], MRN: [id number],    Date: 03/26/2018  Time Spent: 50 minutes   Treatment Type: Individual Therapy  Reported Symptoms: anxiety, stress.  Mental Status Exam:  Appearance:   Well Groomed     Behavior:  Appropriate  Motor:  Normal  Speech/Language:   Clear and Coherent  Affect:  Appropriate  Mood:  anxious  Thought process:  normal  Thought content:    WNL  Sensory/Perceptual disturbances:    WNL  Orientation:  oriented to person, place, time/date and situation  Attention:  Good  Concentration:  Good  Memory:  WNL  Fund of knowledge:   Good  Insight:    Good  Judgment:   Good  Impulse Control:  Good   Risk Assessment: Danger to Self:  No Self-injurious Behavior: No Danger to Others: No Duty to Warn:no Physical Aggression / Violence:No  Access to Firearms a concern: No  Gang Involvement:No   Subjective: The client reports that she has had a conflict with her mother of her boyfriends grandchild.  This woman and her boyfriend's son are living in her rental house.  She has been keeping the grandson as a way to help them out.  The "baby mama" has provoked a client.  In the past the client would have just taken it on the chin and put up with her bad behavior. Today the client discussed how she has been using assertive communication to set firm boundaries and call out this woman's passive-aggressive and manipulative behavior.  She has been surprised at how well the boundaries have worked.  Today we continued to review boundary setting and assertive communication.  Client has also noticed an improvement in her mood. She continues to have no contact with her mother since her graduation in December.  She will be starting online classes this summer with [employer] with the eye towards completing her 4-year degree.  Interventions: Assertiveness/Communication, Systems analyst,  Solution-Oriented/Positive Psychology and Insight-Oriented  Diagnosis:   ICD-10-CM   1. Generalized anxiety disorder F41.1     Plan: Assertiveness, boundaries, self-care, positive self talk.  This record has been created using [employer].  Chart creation errors have been sought, but [name] [name] not always have been located and corrected. Such creation errors do not reflect on the standard of medical care.   [name] [name], [place]

## 2018-04-23 ENCOUNTER — Other Ambulatory Visit: Payer: Self-pay

## 2018-04-23 ENCOUNTER — Ambulatory Visit: Payer: BC Managed Care – PPO | Admitting: Psychiatry

## 2018-05-21 ENCOUNTER — Ambulatory Visit: Payer: BC Managed Care – PPO | Admitting: Psychiatry

## 2018-06-29 ENCOUNTER — Ambulatory Visit: Payer: BC Managed Care – PPO | Admitting: Psychiatry

## 2019-12-12 DIAGNOSIS — E282 Polycystic ovarian syndrome: Secondary | ICD-10-CM | POA: Insufficient documentation

## 2021-01-12 DIAGNOSIS — Z6841 Body Mass Index (BMI) 40.0 and over, adult: Secondary | ICD-10-CM | POA: Insufficient documentation

## 2021-02-05 DIAGNOSIS — R7401 Elevation of levels of liver transaminase levels: Secondary | ICD-10-CM | POA: Insufficient documentation

## 2021-02-05 DIAGNOSIS — R030 Elevated blood-pressure reading, without diagnosis of hypertension: Secondary | ICD-10-CM | POA: Insufficient documentation

## 2021-02-25 ENCOUNTER — Other Ambulatory Visit (HOSPITAL_COMMUNITY): Payer: Self-pay | Admitting: General Surgery

## 2021-02-25 ENCOUNTER — Other Ambulatory Visit (HOSPITAL_BASED_OUTPATIENT_CLINIC_OR_DEPARTMENT_OTHER): Payer: Self-pay | Admitting: General Surgery

## 2021-02-25 ENCOUNTER — Other Ambulatory Visit: Payer: Self-pay | Admitting: General Surgery

## 2021-02-25 DIAGNOSIS — K219 Gastro-esophageal reflux disease without esophagitis: Secondary | ICD-10-CM

## 2021-02-25 DIAGNOSIS — E669 Obesity, unspecified: Secondary | ICD-10-CM

## 2021-02-25 DIAGNOSIS — E1169 Type 2 diabetes mellitus with other specified complication: Secondary | ICD-10-CM

## 2021-02-25 DIAGNOSIS — K429 Umbilical hernia without obstruction or gangrene: Secondary | ICD-10-CM

## 2021-02-25 DIAGNOSIS — Z8711 Personal history of peptic ulcer disease: Secondary | ICD-10-CM

## 2021-02-26 ENCOUNTER — Ambulatory Visit (HOSPITAL_BASED_OUTPATIENT_CLINIC_OR_DEPARTMENT_OTHER)
Admission: RE | Admit: 2021-02-26 | Discharge: 2021-02-26 | Disposition: A | Discharge status: Home / Self Care | Payer: 59 | Source: Ambulatory Visit | Attending: General Surgery | Admitting: General Surgery

## 2021-02-26 ENCOUNTER — Other Ambulatory Visit: Payer: Self-pay

## 2021-02-26 DIAGNOSIS — Z8711 Personal history of peptic ulcer disease: Secondary | ICD-10-CM | POA: Diagnosis present

## 2021-02-26 DIAGNOSIS — E669 Obesity, unspecified: Secondary | ICD-10-CM | POA: Insufficient documentation

## 2021-02-26 DIAGNOSIS — E1169 Type 2 diabetes mellitus with other specified complication: Secondary | ICD-10-CM | POA: Diagnosis present

## 2021-02-26 DIAGNOSIS — K429 Umbilical hernia without obstruction or gangrene: Secondary | ICD-10-CM | POA: Diagnosis present

## 2021-02-26 DIAGNOSIS — K219 Gastro-esophageal reflux disease without esophagitis: Secondary | ICD-10-CM | POA: Diagnosis present

## 2021-02-26 MED ORDER — IOHEXOL 300 MG/ML  SOLN
100.0000 mL | Freq: Once | INTRAMUSCULAR | Status: AC | PRN
  Administered 2021-02-26: 100 mL via INTRAVENOUS

## 2021-03-01 LAB — POCT I-STAT CREATININE: Creatinine, Ser: 0.6 mg/dL (ref 0.44–1.00)

## 2021-07-22 ENCOUNTER — Ambulatory Visit: Payer: BC Managed Care – PPO | Admitting: Physician Assistant

## 2021-08-11 ENCOUNTER — Ambulatory Visit: Payer: BC Managed Care – PPO | Admitting: Physician Assistant

## 2021-09-23 DIAGNOSIS — R635 Abnormal weight gain: Secondary | ICD-10-CM | POA: Insufficient documentation

## 2021-11-02 ENCOUNTER — Encounter (INDEPENDENT_AMBULATORY_CARE_PROVIDER_SITE_OTHER): Payer: Self-pay

## 2021-12-23 DIAGNOSIS — Z85820 Personal history of malignant melanoma of skin: Secondary | ICD-10-CM | POA: Insufficient documentation

## 2021-12-23 DIAGNOSIS — K429 Umbilical hernia without obstruction or gangrene: Secondary | ICD-10-CM | POA: Insufficient documentation

## 2022-01-25 DIAGNOSIS — R7303 Prediabetes: Secondary | ICD-10-CM | POA: Insufficient documentation

## 2022-01-25 DIAGNOSIS — G4762 Sleep related leg cramps: Secondary | ICD-10-CM | POA: Insufficient documentation

## 2022-01-31 ENCOUNTER — Ambulatory Visit (INDEPENDENT_AMBULATORY_CARE_PROVIDER_SITE_OTHER): Payer: Medicaid Other | Admitting: Internal Medicine

## 2022-01-31 ENCOUNTER — Encounter (INDEPENDENT_AMBULATORY_CARE_PROVIDER_SITE_OTHER): Payer: Self-pay | Admitting: Internal Medicine

## 2022-01-31 VITALS — BP 120/80 | HR 95 | Temp 97.9°F | Ht 67.0 in

## 2022-01-31 DIAGNOSIS — E7800 Pure hypercholesterolemia, unspecified: Secondary | ICD-10-CM | POA: Insufficient documentation

## 2022-01-31 DIAGNOSIS — E1169 Type 2 diabetes mellitus with other specified complication: Secondary | ICD-10-CM | POA: Diagnosis not present

## 2022-01-31 DIAGNOSIS — E119 Type 2 diabetes mellitus without complications: Secondary | ICD-10-CM | POA: Insufficient documentation

## 2022-01-31 DIAGNOSIS — E669 Obesity, unspecified: Secondary | ICD-10-CM | POA: Insufficient documentation

## 2022-01-31 DIAGNOSIS — Z7985 Long-term (current) use of injectable non-insulin antidiabetic drugs: Secondary | ICD-10-CM

## 2022-01-31 DIAGNOSIS — Z6841 Body Mass Index (BMI) 40.0 and over, adult: Secondary | ICD-10-CM

## 2022-01-31 DIAGNOSIS — I10 Essential (primary) hypertension: Secondary | ICD-10-CM | POA: Insufficient documentation

## 2022-01-31 DIAGNOSIS — Z0289 Encounter for other administrative examinations: Secondary | ICD-10-CM

## 2022-01-31 NOTE — Progress Notes (Signed)
Office: [phone]  /  Fax: [phone]   Initial Visit  [name] [name] was seen in clinic today to evaluate for obesity. She is interested in losing weight to improve overall health and reduce the risk of weight related complications. She presents today to review program treatment options, initial physical assessment, and evaluation.   Former Medical illustrator, recently diagnosed with T2DM. Gained weight with accident.   She was referred by: PCP  When asked what else they would like to accomplish? She states: Improve energy levels and physical activity, Improve existing medical conditions, Reduce number of medications, and Improve quality of life  When asked how has your weight affected you? She states: Contributed to medical problems, Contributed to orthopedic problems or mobility issues, Having fatigue, Having poor endurance, and Problems with depression and or anxiety  Some associated conditions: Hypertension, Hyperlipidemia, and Diabetes  Contributing factors: Disruption of circadian rhythm, Nutritional, Stress, Reduced physical activity, and Life event  Weight promoting medications identified: None  Current nutrition plan: None  Current level of physical activity: None  Current or previous pharmacotherapy: GLP-1  Response to medication: Lost weight and was able to maintain weight loss   Past medical history includes:   Past Medical History:  Diagnosis Date   Melanoma ([place])    52 yo on back     Objective:   BP 120/80   Pulse 95   Temp 97.9 F (36.6 C)   Ht '5\' 7"'$  (1.702 m)   SpO2 96%   BMI 40.72 kg/m  She was weighed on the bioimpedance scale: Body mass index is 40.72 kg/m.   Body Fat%:50, Visceral Fat Rating:17, Weight trend over the last 12 months: Unchanged  General:  Alert, oriented and cooperative. Patient is in no acute distress.  Respiratory: Normal respiratory effort, no problems with respiration noted  Extremities: Normal range of motion.    Mental  Status: Normal mood and affect. Normal behavior. Normal judgment and thought content.   DIAGNOSTIC DATA REVIEWED:  BMET    Component Value Date/Time   NA 137 07/10/2015 1552   K 4.0 07/10/2015 1552   CL 107 07/10/2015 1552   CO2 19 (L) 07/10/2015 1552   GLUCOSE 101 (H) 07/10/2015 1552   BUN 14 07/10/2015 1552   CREATININE 0.60 02/26/2021 1629   CALCIUM 10.0 07/10/2015 1552   GFRNONAA >60 07/10/2015 1552   GFRAA >60 07/10/2015 1552   No results found for: "HGBA1C" No results found for: "INSULIN" CBC    Component Value Date/Time   WBC 6.8 07/10/2015 1552   RBC 4.73 07/10/2015 1552   HGB 12.6 07/10/2015 1552   HCT 38.9 07/10/2015 1552   PLT 437 (H) 07/10/2015 1552   MCV 82.2 07/10/2015 1552   MCH 26.6 07/10/2015 1552   MCHC 32.4 07/10/2015 1552   RDW 14.3 07/10/2015 1552   Iron/TIBC/Ferritin/ %Sat No results found for: "IRON", "TIBC", "FERRITIN", "IRONPCTSAT" Lipid Panel  No results found for: "CHOL", "TRIG", "HDL", "CHOLHDL", "VLDL", "LDLCALC", "LDLDIRECT" Hepatic Function Panel  No results found for: "PROT", "ALBUMIN", "AST", "ALT", "ALKPHOS", "BILITOT", "BILIDIR", "IBILI" No results found for: "TSH"   Assessment and Plan:   1. Pure hypercholesterolemia Unable to retrieve most recent LDL cholesterol.  She is currently on rosuvastatin 20 mg a day without any adverse effects.  We will check a fasting lipid panel with her intake labs and assess cardiovascular risk at that time.  Continue statin therapy for now.  2. Primary hypertension Per history, blood pressure at goal for age and  risk category.  She is not on blood pressure medications.  We need to obtain medical records.  We will check renal parameters with intake labs.   3. Type 2 diabetes mellitus with other specified complication, without long-term current use of insulin (HCC) HgbA1c is at goal for age and comorbid conditions. Denies symptoms of hypoglycemia or hyperglycemia. On Ozempic 0.25 mg with good  adherence and no side effects.   Counseled on goals of care, monitoring for complications and importance of staying updated on immunizations and diabetes preventive measures.   No results found for: "HGBA1C" Lab Results  Component Value Date   CREATININE 0.60 02/26/2021   Weight loss therapy of 7 to 10% will improve glycemic control.  We will check hemoglobin A1c as well as fasting lipid panel at her intake visit.   4. Class 3 severe obesity with serious comorbidity and body mass index (BMI) of 40.0 to 44.9 in adult, unspecified obesity type ([place]) We reviewed weight, biometrics, associated medical conditions and contributing factors with patient. She would benefit from weight loss therapy via a modified calorie, low-carb, high-protein nutritional plan tailored to their REE (resting energy expenditure) which will be determined by indirect calorimetry.  We will also assess for cardiometabolic risk and nutritional derangements via fasting serologies at her next appointment.       Obesity Treatment / Action Plan:  Patient will work on garnering support from family and friends to begin weight loss journey. Will work on eliminating or reducing the presence of highly palatable, calorie dense foods in the home. Will complete provided nutritional and psychosocial assessment questionnaire before the next appointment. Will be scheduled for indirect calorimetry to determine resting energy expenditure in a fasting state.  This will allow [place] to create a reduced calorie, high-protein meal plan to promote loss of fat mass while preserving muscle mass. Counseled on the health benefits of losing 5%-15% of total body weight. Was counseled on nutritional approaches to weight loss and benefits of complex carbs and high quality protein as part of nutritional weight management. Was counseled on pharmacotherapy and role as an adjunct in weight management.   Obesity Education Performed Today:  She was weighed  on the bioimpedance scale and results were discussed and documented in the synopsis.  We discussed obesity as a disease and the importance of a more detailed evaluation of all the factors contributing to the disease.  We discussed the importance of long term lifestyle changes which include nutrition, exercise and behavioral modifications as well as the importance of customizing this to her specific health and social needs.  We discussed the benefits of reaching a healthier weight to alleviate the symptoms of existing conditions and reduce the risks of the biomechanical, metabolic and psychological effects of obesity.  [name] [name] appears to be in the action stage of change and states they are ready to start intensive lifestyle modifications and behavioral modifications.  30 minutes was spent today on this visit including the above counseling, pre-visit chart review, and post-visit documentation.  Reviewed by clinician on day of visit: allergies, medications, problem list, medical history, surgical history, family history, social history, and previous encounter notes.    I have reviewed the above documentation for accuracy and completeness, and I agree with the above. Thomes Dinning, MD

## 2022-02-22 DIAGNOSIS — M5416 Radiculopathy, lumbar region: Secondary | ICD-10-CM | POA: Insufficient documentation

## 2022-02-22 DIAGNOSIS — G8929 Other chronic pain: Secondary | ICD-10-CM | POA: Insufficient documentation

## 2022-03-02 ENCOUNTER — Ambulatory Visit (INDEPENDENT_AMBULATORY_CARE_PROVIDER_SITE_OTHER): Payer: Medicaid Other | Admitting: Internal Medicine

## 2022-03-02 ENCOUNTER — Encounter (INDEPENDENT_AMBULATORY_CARE_PROVIDER_SITE_OTHER): Payer: Self-pay | Admitting: Internal Medicine

## 2022-03-02 VITALS — BP 127/80 | HR 86 | Temp 98.2°F | Ht 67.0 in | Wt 275.0 lb

## 2022-03-02 DIAGNOSIS — Z6841 Body Mass Index (BMI) 40.0 and over, adult: Secondary | ICD-10-CM

## 2022-03-02 DIAGNOSIS — I10 Essential (primary) hypertension: Secondary | ICD-10-CM

## 2022-03-02 DIAGNOSIS — G4733 Obstructive sleep apnea (adult) (pediatric): Secondary | ICD-10-CM | POA: Insufficient documentation

## 2022-03-02 DIAGNOSIS — R0602 Shortness of breath: Secondary | ICD-10-CM | POA: Diagnosis not present

## 2022-03-02 DIAGNOSIS — Z1331 Encounter for screening for depression: Secondary | ICD-10-CM | POA: Insufficient documentation

## 2022-03-02 DIAGNOSIS — E1169 Type 2 diabetes mellitus with other specified complication: Secondary | ICD-10-CM | POA: Diagnosis not present

## 2022-03-02 DIAGNOSIS — R5383 Other fatigue: Secondary | ICD-10-CM | POA: Insufficient documentation

## 2022-03-02 DIAGNOSIS — Z7985 Long-term (current) use of injectable non-insulin antidiabetic drugs: Secondary | ICD-10-CM

## 2022-03-02 DIAGNOSIS — R29818 Other symptoms and signs involving the nervous system: Secondary | ICD-10-CM | POA: Insufficient documentation

## 2022-03-02 DIAGNOSIS — E7800 Pure hypercholesterolemia, unspecified: Secondary | ICD-10-CM

## 2022-03-02 NOTE — Assessment & Plan Note (Signed)
Patient has symptoms of sleep disordered breathing and high risk phenotype.  Her neck size is 17 inches and she has a Mallampati of 4.  Counseled on risks associated with undiagnosed / untreated disorder. Recommend she have [facility]. Losing 15% of BW may improve symptoms.  We will address referral for sleep study at the next office visit or defer to primary care team.

## 2022-03-02 NOTE — Assessment & Plan Note (Signed)
I do not have access to her most recent A1c.  I have looked at care everywhere.  She is currently on Ozempic with good response and no adverse effects.  She denies hypoglycemia symptoms of hyperglycemia.  We will check hemoglobin A1c and fasting lipid profile today.  She will continue on GLP-1 therapy.  Patient had side effects to metformin in the past

## 2022-03-02 NOTE — Assessment & Plan Note (Signed)
Blood pressure is close to goal.  She is currently not on blood pressure medications.  I recommend monitoring blood pressure at home.  We will check renal parameters.  Losing 10% of body weight will improve condition.

## 2022-03-02 NOTE — Assessment & Plan Note (Signed)
I do not have results of her most recent lipid panel.  She is currently on rosuvastatin 20 mg a day without any adverse effects.  We will check fasting lipid profile today to assess cardiovascular risk.

## 2022-03-02 NOTE — Progress Notes (Unsigned)
Chief Complaint:   OBESITY [name] [name] (MR# [id number]) is a 52 y.o. female who presents for evaluation and treatment of obesity and related comorbidities. Current BMI is Body mass index is 43.07 kg/m. Naturelle has been struggling with her weight for many years and has been unsuccessful in either losing weight, maintaining weight loss, or reaching her healthy weight goal.  Mindy is currently in the action stage of change and ready to dedicate time achieving and maintaining a healthier weight. Thurma is interested in becoming our patient and working on intensive lifestyle modifications including (but not limited to) diet and exercise for weight loss.  Loletha's habits were reviewed today and are as follows: Her family eats meals together, she thinks her family will eat healthier with her, her desired weight loss is 115 lbs, she has been heavy most of her life, she started gaining weight in 2015, her heaviest weight ever was 300 pounds, she has significant food cravings issues, she snacks frequently in the evenings, she skips meals frequently, she is frequently drinking liquids with calories, she frequently makes poor food choices, and she struggles with emotional eating.  Depression Screen Aamna's Food and Mood (modified PHQ-9) score was 27.  Subjective:   1. Other fatigue Grizel admits to daytime somnolence and admits to waking up still tired. Patient has a history of symptoms of daytime fatigue and morning fatigue. Lurene generally gets 6 or 7 hours of sleep per night, and states that she has poor sleep quality. Snoring is present. Apneic episodes are present. Epworth Sleepiness Score is 16.   2. SOB (shortness of breath) on exertion Azira notes increasing shortness of breath with exercising and seems to be worsening over time with weight gain. She notes getting out of breath sooner with activity than she used to. This has gotten worse recently. Rennata denies shortness of breath at rest or  orthopnea.  3. Primary hypertension Blood pressure is close to goal.  She is currently not on blood pressure medications.   4. Pure hypercholesterolemia I do not have results of her most recent lipid panel.  She is currently on rosuvastatin 20 mg a day without any adverse effects.  5. Suspected sleep apnea Patient has symptoms of sleep disordered breathing and high risk phenotype.  Her neck size is 17 inches and she has a Mallampati of 4.  6. Type 2 diabetes mellitus with other specified complication, without long-term current use of insulin ([place]) I do not have access to her most recent A1c.  I have looked at care everywhere.  She is currently on Ozempic with good response and no adverse effects.  She denies hypoglycemia symptoms of hyperglycemia.  Assessment/Plan:   1. Other fatigue Brunette does feel that her weight is causing her energy to be lower than it should be. Fatigue may be related to obesity, depression or many other causes. Labs will be ordered, and in the meanwhile, Velisa will focus on self care including making healthy food choices, increasing physical activity and focusing on stress reduction. - EKG 12-Lead  2. SOB (shortness of breath) on exertion Colie does feel that she gets out of breath more easily that she used to when she exercises. Lisabeth's shortness of breath appears to be obesity related and exercise induced. She has agreed to work on weight loss and gradually increase exercise to treat her exercise induced shortness of breath. Will continue to monitor closely.  3. Primary hypertension I recommend monitoring blood pressure at home. We  will check renal parameters. Losing 10% of body weight will improve condition.  4. Pure hypercholesterolemia We will check fasting lipid profile today to assess cardiovascular risk.  Lab/Orders today: - Lipid Panel With LDL/HDL Ratio  5. Suspected sleep apnea Counseled on risks associated with undiagnosed / untreated disorder.  Recommend she have PSMG. Losing 15% of BW may improve symptoms.  We will address referral for sleep study at the next office visit or defer to primary care team.  6. Type 2 diabetes mellitus with other specified complication, without long-term current use of insulin ([place]) We will check hemoglobin A1c and fasting lipid profile today.  She will continue on GLP-1 therapy.  Patient had side effects to metformin in the past  Lab/Orders: - Vitamin B12 - CBC with Differential/Platelet - Comprehensive metabolic panel - Hemoglobin A1c - Insulin, random - TSH - VITAMIN D 25 Hydroxy (Vit-D Deficiency, Fractures) - Lipid Panel With LDL/HDL Ratio  7. Depression screen Kalynn had a positive depression screening. Depression is commonly associated with obesity and often results in emotional eating behaviors. We will monitor this closely and work on CBT to help improve the non-hunger eating patterns. Referral to Psychology may be required if no improvement is seen as she continues in our clinic.  8. Class 3 severe obesity with serious comorbidity and body mass index (BMI) of 40.0 to 44.9 in adult, unspecified obesity type (HCC)  Stephani is currently in the action stage of change and her goal is to continue with weight loss efforts. I recommend Keylan begin the structured treatment plan as follows:  She has agreed to the Category 3 Plan.  Exercise goals: All adults should avoid inactivity. Some physical activity is better than none, and adults who participate in any amount of physical activity gain some health benefits.   Behavioral modification strategies: increasing lean protein intake, decreasing simple carbohydrates, increasing vegetables, increasing water intake, decreasing liquid calories, increasing high fiber foods, no skipping meals, meal planning and cooking strategies, keeping healthy foods in the home, better snacking choices, emotional eating strategies, avoiding temptations, and planning for  success.  She was informed of the importance of frequent follow-up visits to maximize her success with intensive lifestyle modifications for her multiple health conditions. She was informed we would discuss her lab results at her next visit unless there is a critical issue that needs to be addressed sooner. Ladrina agreed to keep her next visit at the agreed upon time to discuss these results.  Objective:   Blood pressure 127/80, pulse 86, temperature 98.2 F (36.8 C), height '5\' 7"'$  (1.702 m), weight 275 lb (124.7 kg), last menstrual period 02/23/2022, SpO2 99 %. Body mass index is 43.07 kg/m.  EKG: Normal sinus rhythm, rate 80.  Indirect Calorimeter completed today shows a VO2 of 431 and a REE of 2981.  Her calculated basal metabolic rate is [id number] thus her basal metabolic rate is better than expected.  General: Cooperative, alert, well developed, in no acute distress. HEENT: Conjunctivae and lids unremarkable. Cardiovascular: Regular rhythm.  Lungs: Normal work of breathing. Neurologic: No focal deficits.   Lab Results  Component Value Date   CREATININE 0.74 03/02/2022   BUN 9 03/02/2022   NA 139 03/02/2022   K 4.6 03/02/2022   CL 104 03/02/2022   CO2 19 (L) 03/02/2022   Lab Results  Component Value Date   ALT 21 03/02/2022   AST 22 03/02/2022   ALKPHOS 69 03/02/2022   BILITOT 0.3 03/02/2022   Lab Results  Component Value Date   HGBA1C 5.8 (H) 03/02/2022   Lab Results  Component Value Date   INSULIN 23.8 03/02/2022   Lab Results  Component Value Date   TSH 1.330 03/02/2022   Lab Results  Component Value Date   CHOL 139 03/02/2022   HDL 39 (L) 03/02/2022   LDLCALC 82 03/02/2022   TRIG 93 03/02/2022   Lab Results  Component Value Date   WBC 5.5 03/02/2022   HGB 13.3 03/02/2022   HCT 41.9 03/02/2022   MCV 85 03/02/2022   PLT 360 03/02/2022    Attestation Statements:   Reviewed by clinician on day of visit: allergies, medications, problem list, medical  history, surgical history, family history, social history, and previous encounter notes.  Time spent on visit including pre-visit chart review and post-visit charting and care was 40 minutes.   I, Kathlene November, BS, CMA, am acting as transcriptionist for Thomes Dinning, MD.  I have reviewed the above documentation for accuracy and completeness, and I agree with the above. -Thomes Dinning, MD

## 2022-03-03 LAB — CBC WITH DIFFERENTIAL/PLATELET
Basophils Absolute: 0 10*3/uL (ref 0.0–0.2)
Basos: 1 %
EOS (ABSOLUTE): 0.3 10*3/uL (ref 0.0–0.4)
Eos: 6 %
Hematocrit: 41.9 % (ref 34.0–46.6)
Hemoglobin: 13.3 g/dL (ref 11.1–15.9)
Immature Grans (Abs): 0 10*3/uL (ref 0.0–0.1)
Immature Granulocytes: 0 %
Lymphocytes Absolute: 1.7 10*3/uL (ref 0.7–3.1)
Lymphs: 31 %
MCH: 27.1 pg (ref 26.6–33.0)
MCHC: 31.7 g/dL (ref 31.5–35.7)
MCV: 85 fL (ref 79–97)
Monocytes Absolute: 0.4 10*3/uL (ref 0.1–0.9)
Monocytes: 7 %
Neutrophils Absolute: 3 10*3/uL (ref 1.4–7.0)
Neutrophils: 55 %
Platelets: 360 10*3/uL (ref 150–450)
RBC: 4.91 x10E6/uL (ref 3.77–5.28)
RDW: 13.3 % (ref 11.7–15.4)
WBC: 5.5 10*3/uL (ref 3.4–10.8)

## 2022-03-03 LAB — COMPREHENSIVE METABOLIC PANEL
ALT: 21 IU/L (ref 0–32)
AST: 22 IU/L (ref 0–40)
Albumin/Globulin Ratio: 1.7 (ref 1.2–2.2)
Albumin: 4.7 g/dL (ref 3.8–4.9)
Alkaline Phosphatase: 69 IU/L (ref 44–121)
BUN/Creatinine Ratio: 12 (ref 9–23)
BUN: 9 mg/dL (ref 6–24)
Bilirubin Total: 0.3 mg/dL (ref 0.0–1.2)
CO2: 19 mmol/L — ABNORMAL LOW (ref 20–29)
Calcium: 9.8 mg/dL (ref 8.7–10.2)
Chloride: 104 mmol/L (ref 96–106)
Creatinine, Ser: 0.74 mg/dL (ref 0.57–1.00)
Globulin, Total: 2.7 g/dL (ref 1.5–4.5)
Glucose: 90 mg/dL (ref 70–99)
Potassium: 4.6 mmol/L (ref 3.5–5.2)
Sodium: 139 mmol/L (ref 134–144)
Total Protein: 7.4 g/dL (ref 6.0–8.5)
eGFR: 98 mL/min/{1.73_m2} (ref >59)

## 2022-03-03 LAB — LIPID PANEL WITH LDL/HDL RATIO
Cholesterol, Total: 139 mg/dL (ref 100–199)
HDL: 39 mg/dL — ABNORMAL LOW (ref >39)
LDL Chol Calc (NIH): 82 mg/dL (ref 0–99)
LDL/HDL Ratio: 2.1 ratio (ref 0.0–3.2)
Triglycerides: 93 mg/dL (ref 0–149)
VLDL Cholesterol Cal: 18 mg/dL (ref 5–40)

## 2022-03-03 LAB — TSH: TSH: 1.33 u[IU]/mL (ref 0.450–4.500)

## 2022-03-03 LAB — HEMOGLOBIN A1C
Est. average glucose Bld gHb Est-mCnc: 120 mg/dL
Hgb A1c MFr Bld: 5.8 % — ABNORMAL HIGH (ref 4.8–5.6)

## 2022-03-03 LAB — VITAMIN B12: Vitamin B-12: 730 pg/mL (ref 232–1245)

## 2022-03-03 LAB — VITAMIN D 25 HYDROXY (VIT D DEFICIENCY, FRACTURES): Vit D, 25-Hydroxy: 26.9 ng/mL — ABNORMAL LOW (ref 30.0–100.0)

## 2022-03-03 LAB — INSULIN, RANDOM: INSULIN: 23.8 u[IU]/mL (ref 2.6–24.9)

## 2022-03-16 ENCOUNTER — Ambulatory Visit (INDEPENDENT_AMBULATORY_CARE_PROVIDER_SITE_OTHER): Payer: Medicaid Other | Admitting: Internal Medicine

## 2022-03-16 ENCOUNTER — Encounter (INDEPENDENT_AMBULATORY_CARE_PROVIDER_SITE_OTHER): Payer: Self-pay | Admitting: Internal Medicine

## 2022-03-16 VITALS — BP 133/74 | HR 97 | Temp 98.2°F | Ht 67.0 in | Wt 275.0 lb

## 2022-03-16 DIAGNOSIS — Z7985 Long-term (current) use of injectable non-insulin antidiabetic drugs: Secondary | ICD-10-CM

## 2022-03-16 DIAGNOSIS — R29818 Other symptoms and signs involving the nervous system: Secondary | ICD-10-CM

## 2022-03-16 DIAGNOSIS — K5903 Drug induced constipation: Secondary | ICD-10-CM

## 2022-03-16 DIAGNOSIS — I10 Essential (primary) hypertension: Secondary | ICD-10-CM | POA: Diagnosis not present

## 2022-03-16 DIAGNOSIS — E1169 Type 2 diabetes mellitus with other specified complication: Secondary | ICD-10-CM

## 2022-03-16 DIAGNOSIS — E7800 Pure hypercholesterolemia, unspecified: Secondary | ICD-10-CM | POA: Diagnosis not present

## 2022-03-16 DIAGNOSIS — E559 Vitamin D deficiency, unspecified: Secondary | ICD-10-CM | POA: Insufficient documentation

## 2022-03-16 DIAGNOSIS — Z6841 Body Mass Index (BMI) 40.0 and over, adult: Secondary | ICD-10-CM

## 2022-03-16 MED ORDER — VITAMIN D3 50 MCG (2000 UT) PO CAPS: 2000.0000 [IU] | ORAL_CAPSULE | Freq: Every day | ORAL | 0 refills | Status: AC

## 2022-03-16 MED ORDER — POLYETHYLENE GLYCOL 3350 17 GM/SCOOP PO POWD: 17.0000 g | Freq: Two times a day (BID) | ORAL | 1 refills | Status: AC | PRN

## 2022-03-16 MED ORDER — BISACODYL 5 MG PO TBEC: 5.0000 mg | DELAYED_RELEASE_TABLET | Freq: Every day | ORAL | 0 refills | Status: AC | PRN

## 2022-03-16 NOTE — Progress Notes (Signed)
Office: [phone]  /  Fax: [phone]  WEIGHT SUMMARY AND BIOMETRICS  Vitals Temp: 98.2 F (36.8 C) BP: 133/74 Pulse Rate: 97 SpO2: 96 %   Anthropometric Measurements Height: '5\' 7"'$  (1.702 m) Weight: 275 lb (124.7 kg) BMI (Calculated): 43.06 Weight at Last Visit: 275 lb Weight Lost Since Last Visit: 0 lb Starting Weight: 275 lb Total Weight Loss (lbs): 0 lb (0 kg) Peak Weight: 300lb   Body Composition  Body Fat %: 49.3 % Fat Mass (lbs): 135.6 lbs Muscle Mass (lbs): 132.4 lbs Total Body Water (lbs): 96.2 lbs Visceral Fat Rating : 16    HPI  Chief Complaint: OBESITY  [name] [name] is here to discuss her progress with her obesity treatment plan. She is on the the Category 3 Plan and states she is following her eating plan approximately 80 % of the time. She states she is exercising 30 minutes 7 times per week.  Interval History:  Since last office visit she has remained weight neutral. She reports good adherence to reduced calorie nutritional plan. Has been  She has been working on reducing simple and added sugars. Down to water and coffee. '[x]'$ Denies '[]'$ Reports problems with appetite and hunger signals.  '[x]'$ Denies '[]'$ Reports problems with satiety and satiation.  '[x]'$ Denies '[]'$ Reports problems with eating patterns and portion control.  '[]'$ Denies '[x]'$ Reports abnormal cravings for sweets Sleeping approximately 7 hours a day.  Sleep described as: '[x]'$ Restorative '[]'$ Unrestorative '[x]'$ Interrupted Stress levels are reported as low and manageable.  Barriers identified none.   Pharmacotherapy for weight loss: She is currently taking Ozempic with diabetes as the primary indication.   Weight promoting medications identified: None.  ASSESSMENT AND PLAN  TREATMENT PLAN FOR OBESITY:  Recommended Dietary Goals  [name] [name] is currently in the action stage of change. As such, her goal is to continue weight management plan. She has agreed to: the Category 3 Plan.  Behavioral  Intervention  We discussed the following Behavioral Modification Strategies today: increasing lean protein intake, decreasing simple carbohydrates , increasing vegetables, increasing water intake, work on meal planning and easy cooking plans, decreasing eating out, consumption of processed foods, and making healthy choices when eating convenient foods, and reading food labels .  Additional resources provided today: None  Recommended Physical Activity Goals  [name] [name] has been advised to work up to 150 minutes of moderate intensity aerobic activity a week and strengthening exercises 2-3 times per week for cardiovascular health, weight loss maintenance and preservation of muscle mass.   She has agreed to :  '[]'$  Continue current level of physical activity  '[x]'$  Think about ways to increase physical activity '[x]'$  Start strengthening exercises with a goal of 2-3 sessions a week  '[]'$  Start aerobic activity with a goal of 150 minutes a week at moderate intensity.  '[]'$  Increase the intensity, frequency or duration of strengthening exercises  '[x]'$  Increase the intensity, frequency or duration of aerobic exercises   '[]'$  Increase physical activity in their day and reduce sedentary time (increase NEAT). '[]'$  Work on scheduling and tracking physical activity.   Pharmacotherapy We discussed various medication options to help [name] [name] with her weight loss efforts and we both agreed to :  Continue Ozempic with the primary indication of diabetes  ASSOCIATED CONDITIONS ADDRESSED TODAY  Type 2 diabetes mellitus with other specified complication, without long-term current use of insulin ([place]) Assessment & Plan: Her most recent A1c is 5.8 and improved.  She is currently on Ozempic 0.5 mg once a week prescribed by her PCP.  She has  been experiencing worsening constipation and we discussed remedies today.  She has also increased fruits and vegetables and drinking more water.  Her Ozempic is scheduled to be increased to 1 mg a  week so we need to monitor closely for worsening constipation.  Orders: -     Ambulatory referral to Sleep Studies  Pure hypercholesterolemia Assessment & Plan: LDL is at goal.  She is on high intensity statin therapy without any adverse effects.  Elevated LDL may be secondary to nutrition, genetics and spillover effect from excess adiposity. Recommended LDL goal is <70 to reduce the risk of fatty streaks and the progression to obstructive ASCVD in the future. Her 10 year risk is: The 10-year ASCVD risk score (Arnett DK, et al., 2019) is: 2.7%  Lab Results  Component Value Date   CHOL 139 03/02/2022   HDL 39 (L) 03/02/2022   LDLCALC 82 03/02/2022   TRIG 93 03/02/2022    Continue weight loss therapy, losing 10% or more of body weight may improve condition. Also advised to reduce saturated fats in diet to less than 10% of daily calories.  She will continue rosuvastatin 20 mg once a day.      Suspected sleep apnea Assessment & Plan: Patient has symptoms of sleep disordered breathing and high risk phenotype.  Her neck size is 17 inches and she has a Mallampati of 4.  Counseled on risks associated with undiagnosed / untreated disorder. Recommend she have PSMG. Losing 15% of BW may improve symptoms.  She is agreeable to referral for sleep study.  Referral entered today  Orders: -     Ambulatory referral to Sleep Studies  Class 3 severe obesity with serious comorbidity and body mass index (BMI) of 40.0 to 44.9 in adult, unspecified obesity type ([place]) -     Ambulatory referral to Sleep Studies  Primary hypertension Assessment & Plan: Blood pressure is close to goal.  She is currently not on blood pressure medications.  I recommend monitoring blood pressure at home.  Her most recent renal parameters are within normal limits.  Losing 10% of body weight will improve condition.   Orders: -     Ambulatory referral to Sleep Studies  Drug-induced constipation Assessment & Plan: She  reports having a colonoscopy.  There is no red flag symptoms.  I suspect this due to GLP-1 affecting gastric emptying.  We discussed several treatment options including MiraLAX and Dulcolax.  She is also increasing fiber and water in her diet.  Orders: -     Polyethylene Glycol 3350; Take 17 g by mouth 2 (two) times daily as needed.  Dispense: 510 g; Refill: 1 -     Bisacodyl; Take 1 tablet (5 mg total) by mouth daily as needed for moderate constipation.  Dispense: 30 tablet; Refill: 0  Vitamin D deficiency Assessment & Plan: Most recent vitamin D levels  Lab Results  Component Value Date   VD25OH 26.9 (L) 03/02/2022     Deficiency state associated with adiposity and may result in leptin resistance, weight gain and fatigue.   Plan: Start vitamin D3 2000 international units daily   Orders: -     Vitamin D3; Take 1 capsule (2,000 Units total) by mouth daily.  Dispense: 90 capsule; Refill: 0     PHYSICAL EXAM:  Blood pressure 133/74, pulse 97, temperature 98.2 F (36.8 C), height '5\' 7"'$  (1.702 m), weight 275 lb (124.7 kg), last menstrual period 02/23/2022, SpO2 96 %. Body mass index is 43.07 kg/m.  General: She is overweight, cooperative, alert, well developed, and in no acute distress. PSYCH: Has normal mood, affect and thought process.   HEENT: EOMI, sclerae are anicteric. Lungs: Normal breathing effort, no conversational dyspnea. Extremities: No edema.  Neurologic: No gross sensory or motor deficits. No tremors or fasciculations noted.    DIAGNOSTIC DATA REVIEWED:  BMET    Component Value Date/Time   NA 139 03/02/2022 1034   K 4.6 03/02/2022 1034   CL 104 03/02/2022 1034   CO2 19 (L) 03/02/2022 1034   GLUCOSE 90 03/02/2022 1034   GLUCOSE 101 (H) 07/10/2015 1552   BUN 9 03/02/2022 1034   CREATININE 0.74 03/02/2022 1034   CALCIUM 9.8 03/02/2022 1034   GFRNONAA >60 07/10/2015 1552   GFRAA >60 07/10/2015 1552   Lab Results  Component Value Date   HGBA1C 5.8 (H)  03/02/2022   Lab Results  Component Value Date   INSULIN 23.8 03/02/2022   Lab Results  Component Value Date   TSH 1.330 03/02/2022   CBC    Component Value Date/Time   WBC 5.5 03/02/2022 1034   WBC 6.8 07/10/2015 1552   RBC 4.91 03/02/2022 1034   RBC 4.73 07/10/2015 1552   HGB 13.3 03/02/2022 1034   HCT 41.9 03/02/2022 1034   PLT 360 03/02/2022 1034   MCV 85 03/02/2022 1034   MCH 27.1 03/02/2022 1034   MCH 26.6 07/10/2015 1552   MCHC 31.7 03/02/2022 1034   MCHC 32.4 07/10/2015 1552   RDW 13.3 03/02/2022 1034   Iron Studies No results found for: "IRON", "TIBC", "FERRITIN", "IRONPCTSAT" Lipid Panel     Component Value Date/Time   CHOL 139 03/02/2022 1034   TRIG 93 03/02/2022 1034   HDL 39 (L) 03/02/2022 1034   LDLCALC 82 03/02/2022 1034   Hepatic Function Panel     Component Value Date/Time   PROT 7.4 03/02/2022 1034   ALBUMIN 4.7 03/02/2022 1034   AST 22 03/02/2022 1034   ALT 21 03/02/2022 1034   ALKPHOS 69 03/02/2022 1034   BILITOT 0.3 03/02/2022 1034      Component Value Date/Time   TSH 1.330 03/02/2022 1034   Nutritional Lab Results  Component Value Date   VD25OH 26.9 (L) 03/02/2022     Return in about 3 weeks (around 04/06/2022) for For Weight Mangement with Dr. Gerarda Fraction.Marland Kitchen She was informed of the importance of frequent follow up visits to maximize her success with intensive lifestyle modifications for her multiple health conditions.   ATTESTASTION STATEMENTS:  Reviewed by clinician on day of visit: allergies, medications, problem list, medical history, surgical history, family history, social history, and previous encounter notes.   Time spent on visit including pre-visit chart review and post-visit care and charting was 40 minutes.    Thomes Dinning, MD

## 2022-03-16 NOTE — Assessment & Plan Note (Signed)
She reports having a colonoscopy.  There is no red flag symptoms.  I suspect this due to GLP-1 affecting gastric emptying.  We discussed several treatment options including MiraLAX and Dulcolax.  She is also increasing fiber and water in her diet.

## 2022-03-16 NOTE — Assessment & Plan Note (Signed)
Patient has symptoms of sleep disordered breathing and high risk phenotype.  Her neck size is 17 inches and she has a Mallampati of 4.  Counseled on risks associated with undiagnosed / untreated disorder. Recommend she have PSMG. Losing 15% of BW may improve symptoms.  She is agreeable to referral for sleep study.  Referral entered today

## 2022-03-16 NOTE — Assessment & Plan Note (Signed)
Blood pressure is close to goal.  She is currently not on blood pressure medications.  I recommend monitoring blood pressure at home.  Her most recent renal parameters are within normal limits.  Losing 10% of body weight will improve condition.

## 2022-03-16 NOTE — Assessment & Plan Note (Signed)
Her most recent A1c is 5.8 and improved.  She is currently on Ozempic 0.5 mg once a week prescribed by her PCP.  She has been experiencing worsening constipation and we discussed remedies today.  She has also increased fruits and vegetables and drinking more water.  Her Ozempic is scheduled to be increased to 1 mg a week so we need to monitor closely for worsening constipation.

## 2022-03-16 NOTE — Assessment & Plan Note (Signed)
LDL is at goal.  She is on high intensity statin therapy without any adverse effects.  Elevated LDL may be secondary to nutrition, genetics and spillover effect from excess adiposity. Recommended LDL goal is <70 to reduce the risk of fatty streaks and the progression to obstructive ASCVD in the future. Her 10 year risk is: The 10-year ASCVD risk score (Arnett DK, et al., 2019) is: 2.7%  Lab Results  Component Value Date   CHOL 139 03/02/2022   HDL 39 (L) 03/02/2022   LDLCALC 82 03/02/2022   TRIG 93 03/02/2022    Continue weight loss therapy, losing 10% or more of body weight may improve condition. Also advised to reduce saturated fats in diet to less than 10% of daily calories.  She will continue rosuvastatin 20 mg once a day.

## 2022-03-16 NOTE — Assessment & Plan Note (Signed)
Most recent vitamin D levels  Lab Results  Component Value Date   VD25OH 26.9 (L) 03/02/2022     Deficiency state associated with adiposity and may result in leptin resistance, weight gain and fatigue.   Plan: Start vitamin D3 2000 international units daily

## 2022-03-30 DIAGNOSIS — M19172 Post-traumatic osteoarthritis, left ankle and foot: Secondary | ICD-10-CM | POA: Insufficient documentation

## 2022-04-07 ENCOUNTER — Ambulatory Visit (INDEPENDENT_AMBULATORY_CARE_PROVIDER_SITE_OTHER): Payer: Medicaid Other | Admitting: Internal Medicine

## 2022-04-07 ENCOUNTER — Encounter (INDEPENDENT_AMBULATORY_CARE_PROVIDER_SITE_OTHER): Payer: Self-pay | Admitting: Internal Medicine

## 2022-04-07 VITALS — BP 110/82 | HR 97 | Temp 97.9°F | Ht 67.0 in | Wt 267.0 lb

## 2022-04-07 DIAGNOSIS — M25572 Pain in left ankle and joints of left foot: Secondary | ICD-10-CM | POA: Diagnosis not present

## 2022-04-07 DIAGNOSIS — R29818 Other symptoms and signs involving the nervous system: Secondary | ICD-10-CM | POA: Diagnosis not present

## 2022-04-07 DIAGNOSIS — G8929 Other chronic pain: Secondary | ICD-10-CM | POA: Diagnosis not present

## 2022-04-07 DIAGNOSIS — E1169 Type 2 diabetes mellitus with other specified complication: Secondary | ICD-10-CM | POA: Diagnosis not present

## 2022-04-07 DIAGNOSIS — Z7985 Long-term (current) use of injectable non-insulin antidiabetic drugs: Secondary | ICD-10-CM

## 2022-04-07 MED ORDER — MELOXICAM 7.5 MG PO TABS: 7.5000 mg | ORAL_TABLET | Freq: Every day | ORAL | 0 refills | Status: DC

## 2022-04-07 NOTE — Assessment & Plan Note (Signed)
Patient is experiencing significant left ankle pain with joint and mobility.  There are no signs of inflammation to suggest crystal induced arthropathy but it is in the differential.  She has been seen by Ortho and has been referred to another specialist.  She has received several steroid shots with inadequate relief.  She will be seeing a specialist soon.  As a courtesy I gave patient a prescription for meloxicam 7.5 mg she will take 2 tablets for 3 days then one 1 tablet daily.  I also advised elevating ankle and putting ice on it.

## 2022-04-07 NOTE — Progress Notes (Signed)
Office: [phone]  /  Fax: [phone]  WEIGHT SUMMARY AND BIOMETRICS  Vitals Temp: 97.9 F (36.6 C) BP: 110/82 Pulse Rate: 97 SpO2: 97 %   Anthropometric Measurements Height: 5\' 7"  (1.702 m) Weight: 267 lb (121.1 kg) BMI (Calculated): 41.81 Weight at Last Visit: 275 lb Weight Lost Since Last Visit: 8 lbs Starting Weight: 275 lb Total Weight Loss (lbs): 8 lb (3.629 kg) Peak Weight: 300 lb   Body Composition  Body Fat %: 49.4 % Fat Mass (lbs): 132 lbs Muscle Mass (lbs): 128.6 lbs Total Body Water (lbs): 94.6 lbs Visceral Fat Rating : 15    HPI  Chief Complaint: OBESITY  [name] [name] is here to discuss her progress with her obesity treatment plan. She is on the the Category 3 Plan and states she is following her eating plan approximately 70% of the time. She states she is not exercising because of left ankle pain  Interval History:  Since last office visit she has 8 pounds but had a gastrointestinal illness for the last 7 days.  She is now on Ozempic 1 mg/week.  She notes experiencing some nausea at the beginning of treatment but this has improved. She reports good adherence to reduced calorie nutritional plan. She has been working on not skipping meals, increasing protein at every meal, eating more fruits, eating more vegetables, drinking more water, avoiding and or reducing liquid calories, and making healthier choices Denies problems with appetite and hunger signals.  Denies problems with satiety and satiation.  Denies problems with eating patterns and portion control.  Denies abnormal cravings. Denies feeling deprived or restricted.   Barriers identified: orthopedic problems or chronic pain affecting mobility.   Pharmacotherapy for weight loss: She is currently taking Ozempic with diabetes as the primary indication.    ASSESSMENT AND PLAN  TREATMENT PLAN FOR OBESITY:  Recommended Dietary Goals  [name] [name] is currently in the action stage of change. As such, her  goal is to continue weight management plan. She has agreed to: continue current plan  Behavioral Intervention  We discussed the following Behavioral Modification Strategies today: increasing lean protein intake, avoiding skipping meals, increasing water intake, and reading food labels .  Additional resources provided today: None  Recommended Physical Activity Goals  [name] [name] has been advised to work up to 150 minutes of moderate intensity aerobic activity a week and strengthening exercises 2-3 times per week for cardiovascular health, weight loss maintenance and preservation of muscle mass.   She has agreed to :  Think about ways to increase physical activity  Pharmacotherapy We discussed various medication options to help [name] [name] with her weight loss efforts and we both agreed to : continue current anti-obesity medication regimen  ASSOCIATED CONDITIONS ADDRESSED TODAY  Chronic pain of left ankle Assessment & Plan: Patient is experiencing significant left ankle pain with joint and mobility.  There are no signs of inflammation to suggest crystal induced arthropathy but it is in the differential.  She has been seen by Ortho and has been referred to another specialist.  She has received several steroid shots with inadequate relief.  She will be seeing a specialist soon.  As a courtesy I gave patient a prescription for meloxicam 7.5 mg she will take 2 tablets for 3 days then one 1 tablet daily.  I also advised elevating ankle and putting ice on it.  Orders: -     Meloxicam; Take 1 tablet (7.5 mg total) by mouth daily.  Dispense: 30 tablet; Refill: 0  Type 2  diabetes mellitus with other specified complication, without long-term current use of insulin Assessment & Plan: Her most recent A1c is 5.8 and improved.  She is currently on Ozempic 1 mg once a week prescribed by her PCP.  She is currently on a bowel regimen and has noticed an improvement in constipation.  She denies any signs or symptoms of  hypoglycemia.  She will continue on current dose   Suspected sleep apnea Assessment & Plan: Patient has symptoms of sleep disordered breathing and high risk phenotype.  Her neck size is 17 inches and she has a Mallampati of 4.  Counseled on risks associated with undiagnosed / untreated disorder.  She was referred for sleep study last office visit but has not schedule sleep study.  Losing 15% of BW may improve symptoms.  She will follow-up with [facility]      PHYSICAL EXAM:  Blood pressure 110/82, pulse 97, temperature 97.9 F (36.6 C), height 5\' 7"  (1.702 m), weight 267 lb (121.1 kg), SpO2 97 %. Body mass index is 41.82 kg/m.  General: She is overweight, cooperative, alert, well developed, and in no acute distress. PSYCH: Has normal mood, affect and thought process.   HEENT: EOMI, sclerae are anicteric. Lungs: Normal breathing effort, no conversational dyspnea. Extremities: No edema.  Neurologic: No gross sensory or motor deficits. No tremors or fasciculations noted.    DIAGNOSTIC DATA REVIEWED:  BMET    Component Value Date/Time   NA 139 03/02/2022 1034   K 4.6 03/02/2022 1034   CL 104 03/02/2022 1034   CO2 19 (L) 03/02/2022 1034   GLUCOSE 90 03/02/2022 1034   GLUCOSE 101 (H) 07/10/2015 1552   BUN 9 03/02/2022 1034   CREATININE 0.74 03/02/2022 1034   CALCIUM 9.8 03/02/2022 1034   GFRNONAA >60 07/10/2015 1552   GFRAA >60 07/10/2015 1552   Lab Results  Component Value Date   HGBA1C 5.8 (H) 03/02/2022   Lab Results  Component Value Date   INSULIN 23.8 03/02/2022   Lab Results  Component Value Date   TSH 1.330 03/02/2022   CBC    Component Value Date/Time   WBC 5.5 03/02/2022 1034   WBC 6.8 07/10/2015 1552   RBC 4.91 03/02/2022 1034   RBC 4.73 07/10/2015 1552   HGB 13.3 03/02/2022 1034   HCT 41.9 03/02/2022 1034   PLT 360 03/02/2022 1034   MCV 85 03/02/2022 1034   MCH 27.1 03/02/2022 1034   MCH 26.6 07/10/2015 1552   MCHC 31.7  03/02/2022 1034   MCHC 32.4 07/10/2015 1552   RDW 13.3 03/02/2022 1034   Iron Studies No results found for: "IRON", "TIBC", "FERRITIN", "IRONPCTSAT" Lipid Panel     Component Value Date/Time   CHOL 139 03/02/2022 1034   TRIG 93 03/02/2022 1034   HDL 39 (L) 03/02/2022 1034   LDLCALC 82 03/02/2022 1034   Hepatic Function Panel     Component Value Date/Time   PROT 7.4 03/02/2022 1034   ALBUMIN 4.7 03/02/2022 1034   AST 22 03/02/2022 1034   ALT 21 03/02/2022 1034   ALKPHOS 69 03/02/2022 1034   BILITOT 0.3 03/02/2022 1034      Component Value Date/Time   TSH 1.330 03/02/2022 1034   Nutritional Lab Results  Component Value Date   VD25OH 26.9 (L) 03/02/2022     Return in about 3 weeks (around 04/28/2022) for For Weight Mangement with Dr. Gerarda Fraction.Marland Kitchen She was informed of the importance of frequent follow up visits to maximize her success  with intensive lifestyle modifications for her multiple health conditions.   ATTESTASTION STATEMENTS:  Reviewed by clinician on day of visit: allergies, medications, problem list, medical history, surgical history, family history, social history, and previous encounter notes.     Thomes Dinning, MD

## 2022-04-07 NOTE — Assessment & Plan Note (Signed)
Patient has symptoms of sleep disordered breathing and high risk phenotype.  Her neck size is 17 inches and she has a Mallampati of 4.  Counseled on risks associated with undiagnosed / untreated disorder.  She was referred for sleep study last office visit but has not schedule sleep study.  Losing 15% of BW may improve symptoms.  She will follow-up with [facility]

## 2022-04-07 NOTE — Progress Notes (Deleted)
WEIGHT SUMMARY AND BIOMETRICS  No data recorded Anthropometric Measurements Height: 5\' 7"  (1.702 m) Weight at Last Visit: 275 lb Starting Weight: 275 lb Peak Weight: 300 lb   No data recorded Other Clinical Data Fasting: No Labs: No Today's Visit #: 3 Starting Date: 03/02/22    Chief Complaint:   OBESITY [name] [name] is here to discuss her progress with her obesity treatment plan. She is on the the Category 3 Plan and states she is following her eating plan approximately 70 % of the time. She states she is not  exercising.    Interim History: ***  Subjective:   There are no diagnoses linked to this encounter.  Assessment/Plan:   There are no diagnoses linked to this encounter. [name] [name] {CHL AMB IS/IS NOT:210130109} currently in the action stage of change. As such, her goal is to {MWMwtloss#1:210800005}. She has agreed to {MWMwtlossportion/plan2:23431}.   Exercise goals: {MWM EXERCISE RECS:23473}  Behavioral modification strategies: {MWMwtlossdietstrategies3:23432}.  [name] [name] has agreed to follow-up with our clinic in {NUMBER 1-10:22536} weeks. She was informed of the importance of frequent follow-up visits to maximize her success with intensive lifestyle modifications for her multiple health conditions.   ***delete paragraph if no labs orderedLisa was informed we would discuss her lab results at her next visit unless there is a critical issue that needs to be addressed sooner. [name] [name] agreed to keep her next visit at the agreed upon time to discuss these results.  Objective:   Height 5\' 7"  (1.702 m). Body mass index is 43.07 kg/m.  General: Cooperative, alert, well developed, in no acute distress. HEENT: Conjunctivae and lids unremarkable. Cardiovascular: Regular rhythm.  Lungs: Normal work of breathing. Neurologic: No focal deficits.   Lab Results  Component Value Date   CREATININE 0.74 03/02/2022   BUN 9 03/02/2022   NA 139 03/02/2022   K 4.6 03/02/2022   CL 104  03/02/2022   CO2 19 (L) 03/02/2022   Lab Results  Component Value Date   ALT 21 03/02/2022   AST 22 03/02/2022   ALKPHOS 69 03/02/2022   BILITOT 0.3 03/02/2022   Lab Results  Component Value Date   HGBA1C 5.8 (H) 03/02/2022   Lab Results  Component Value Date   INSULIN 23.8 03/02/2022   Lab Results  Component Value Date   TSH 1.330 03/02/2022   Lab Results  Component Value Date   CHOL 139 03/02/2022   HDL 39 (L) 03/02/2022   LDLCALC 82 03/02/2022   TRIG 93 03/02/2022   Lab Results  Component Value Date   VD25OH 26.9 (L) 03/02/2022   Lab Results  Component Value Date   WBC 5.5 03/02/2022   HGB 13.3 03/02/2022   HCT 41.9 03/02/2022   MCV 85 03/02/2022   PLT 360 03/02/2022   No results found for: "IRON", "TIBC", "FERRITIN"  Obesity Behavioral Intervention:   Approximately 15 minutes were spent on the discussion below.  ASK: We discussed the diagnosis of obesity with [name] [name] today and [name] [name] agreed to give [place] permission to discuss obesity behavioral modification therapy today.  ASSESS: [name] [name] has the diagnosis of obesity and her BMI today is ***. [name] [name] {ACTION; IS/IS GI:[id number] in the action stage of change.   ADVISE: [name] [name] was educated on the multiple health risks of obesity as well as the benefit of weight loss to improve her health. She was advised of the need for long term treatment and the importance of lifestyle modifications to improve her current health and to decrease her  risk of future health problems.  AGREE: Multiple dietary modification options and treatment options were discussed and [name] [name] agreed to follow the recommendations documented in the above note.  ARRANGE: [name] [name] was educated on the importance of frequent visits to treat obesity as outlined per CMS and USPSTF guidelines and agreed to schedule her next follow up appointment today.  Attestation Statements:   Reviewed by clinician on day of visit: allergies, medications, problem list, medical  history, surgical history, family history, social history, and previous encounter notes.  ***(delete if time-based billing not used)Time spent on visit including pre-visit chart review and post-visit care and charting was *** minutes.   I, ***, am acting as transcriptionist for ***.  I have reviewed the above documentation for accuracy and completeness, and I agree with the above. -  ***

## 2022-04-07 NOTE — Assessment & Plan Note (Signed)
Her most recent A1c is 5.8 and improved.  She is currently on Ozempic 1 mg once a week prescribed by her PCP.  She is currently on a bowel regimen and has noticed an improvement in constipation.  She denies any signs or symptoms of hypoglycemia.  She will continue on current dose

## 2022-05-04 ENCOUNTER — Ambulatory Visit (INDEPENDENT_AMBULATORY_CARE_PROVIDER_SITE_OTHER): Payer: Medicaid Other | Admitting: Internal Medicine

## 2022-05-04 ENCOUNTER — Encounter (INDEPENDENT_AMBULATORY_CARE_PROVIDER_SITE_OTHER): Payer: Self-pay | Admitting: Internal Medicine

## 2022-05-04 VITALS — BP 106/66 | HR 108 | Temp 98.5°F | Ht 67.0 in | Wt 263.0 lb

## 2022-05-04 DIAGNOSIS — Z6841 Body Mass Index (BMI) 40.0 and over, adult: Secondary | ICD-10-CM | POA: Diagnosis not present

## 2022-05-04 DIAGNOSIS — Z7985 Long-term (current) use of injectable non-insulin antidiabetic drugs: Secondary | ICD-10-CM

## 2022-05-04 DIAGNOSIS — E1169 Type 2 diabetes mellitus with other specified complication: Secondary | ICD-10-CM | POA: Diagnosis not present

## 2022-05-04 MED ORDER — SEMAGLUTIDE (2 MG/DOSE) 8 MG/3ML ~~LOC~~ SOPN: 2.0000 mg | PEN_INJECTOR | SUBCUTANEOUS | 0 refills | Status: DC

## 2022-05-04 NOTE — Assessment & Plan Note (Signed)
Diabetes is well-controlled.  She is currently on semaglutide 1 mg once a week without any adverse effects.  We will increase to 2 mg/week to assist with obesity and associated conditions.  She is maintaining a bowel regimen and denies problems with constipation.  No reports of hypoglycemia.  Continues to work on nutritional and behavioral strategies for weight loss.

## 2022-05-04 NOTE — Progress Notes (Signed)
Office: [phone]  /  Fax: [phone]  WEIGHT SUMMARY AND BIOMETRICS  Vitals Temp: 98.5 F (36.9 C) BP: 106/66 Pulse Rate: (!) 108 SpO2: 96 %   Anthropometric Measurements Height: 5\' 7"  (1.702 m) Weight: 263 lb (119.3 kg) BMI (Calculated): 41.18 Weight at Last Visit: 267 lb Weight Lost Since Last Visit: 4 lb Starting Weight: 275 lb Total Weight Loss (lbs): 12 lb (5.443 kg) Peak Weight: 300 lb   Body Composition  Body Fat %: 49.4 % Fat Mass (lbs): 130.2 lbs Muscle Mass (lbs): 126.6 lbs Total Body Water (lbs): 95 lbs Visceral Fat Rating : 15    No data recorded Today's Visit #: 4  Starting Date: 03/02/22   HPI  Chief Complaint: OBESITY  [name] [name] is here to discuss her progress with her obesity treatment plan. She is on the the Category 3 Plan and states she is following her eating plan approximately 80 % of the time. She states she is working more.   Interval History:  Since last office visit she has 4 lbs. She reports good adherence to reduced calorie nutritional plan. She has been working on begun to exercise Denies problems with appetite and hunger signals.  Denies problems with satiety and satiation.  Denies problems with eating patterns and portion control.  Denies abnormal cravings. Denies feeling deprived or restricted.   Barriers identified: orthopedic problems, medical conditions or chronic pain affecting mobility.   Pharmacotherapy for weight loss: She is currently taking Ozempic with diabetes as the primary indication.    ASSESSMENT AND PLAN  TREATMENT PLAN FOR OBESITY:  Recommended Dietary Goals  Julienne is currently in the action stage of change. As such, her goal is to continue weight management plan. She has agreed to: continue current plan  Behavioral Intervention  We discussed the following Behavioral Modification Strategies today: increasing lean protein intake, increasing vegetables, increasing water intake, continue to practice  mindfulness when eating, planning for success, and better snacking choices.  Additional resources provided today: None  Recommended Physical Activity Goals  Amberlyn has been advised to work up to 150 minutes of moderate intensity aerobic activity a week and strengthening exercises 2-3 times per week for cardiovascular health, weight loss maintenance and preservation of muscle mass.   She has agreed to :  [employer] at pool. T/C chair strengthening  Pharmacotherapy We discussed various medication options to help Coral with her weight loss efforts and we both agreed to :  Continue semaglutide with primary indication of type 2 diabetes.  ASSOCIATED CONDITIONS ADDRESSED TODAY  Type 2 diabetes mellitus with other specified complication, without long-term current use of insulin (HCC) Assessment & Plan: Diabetes is well-controlled.  She is currently on semaglutide 1 mg once a week without any adverse effects.  We will increase to 2 mg/week to assist with obesity and associated conditions.  She is maintaining a bowel regimen and denies problems with constipation.  No reports of hypoglycemia.  Continues to work on nutritional and behavioral strategies for weight loss.  Orders: -     Semaglutide (2 MG/DOSE); Inject 2 mg as directed once a week.  Dispense: 3 mL; Refill: 0  Class 3 severe obesity with serious comorbidity and body mass index (BMI) of 40.0 to 44.9 in adult, unspecified obesity type (HCC)     PHYSICAL EXAM:  Blood pressure 106/66, pulse (!) 108, temperature 98.5 F (36.9 C), height 5\' 7"  (1.702 m), weight 263 lb (119.3 kg), last menstrual period 04/20/2022, SpO2 96 %. Body mass index  is 41.19 kg/m.  General: She is overweight, cooperative, alert, well developed, and in no acute distress. PSYCH: Has normal mood, affect and thought process.   HEENT: EOMI, sclerae are anicteric. Lungs: Normal breathing effort, no conversational dyspnea. Extremities: No edema.  Neurologic: No  gross sensory or motor deficits. No tremors or fasciculations noted.    DIAGNOSTIC DATA REVIEWED:  BMET    Component Value Date/Time   NA 139 03/02/2022 1034   K 4.6 03/02/2022 1034   CL 104 03/02/2022 1034   CO2 19 (L) 03/02/2022 1034   GLUCOSE 90 03/02/2022 1034   GLUCOSE 101 (H) 07/10/2015 1552   BUN 9 03/02/2022 1034   CREATININE 0.74 03/02/2022 1034   CALCIUM 9.8 03/02/2022 1034   GFRNONAA >60 07/10/2015 1552   GFRAA >60 07/10/2015 1552   Lab Results  Component Value Date   HGBA1C 5.8 (H) 03/02/2022   Lab Results  Component Value Date   INSULIN 23.8 03/02/2022   Lab Results  Component Value Date   TSH 1.330 03/02/2022   CBC    Component Value Date/Time   WBC 5.5 03/02/2022 1034   WBC 6.8 07/10/2015 1552   RBC 4.91 03/02/2022 1034   RBC 4.73 07/10/2015 1552   HGB 13.3 03/02/2022 1034   HCT 41.9 03/02/2022 1034   PLT 360 03/02/2022 1034   MCV 85 03/02/2022 1034   MCH 27.1 03/02/2022 1034   MCH 26.6 07/10/2015 1552   MCHC 31.7 03/02/2022 1034   MCHC 32.4 07/10/2015 1552   RDW 13.3 03/02/2022 1034   Iron Studies No results found for: "IRON", "TIBC", "FERRITIN", "IRONPCTSAT" Lipid Panel     Component Value Date/Time   CHOL 139 03/02/2022 1034   TRIG 93 03/02/2022 1034   HDL 39 (L) 03/02/2022 1034   LDLCALC 82 03/02/2022 1034   Hepatic Function Panel     Component Value Date/Time   PROT 7.4 03/02/2022 1034   ALBUMIN 4.7 03/02/2022 1034   AST 22 03/02/2022 1034   ALT 21 03/02/2022 1034   ALKPHOS 69 03/02/2022 1034   BILITOT 0.3 03/02/2022 1034      Component Value Date/Time   TSH 1.330 03/02/2022 1034   Nutritional Lab Results  Component Value Date   VD25OH 26.9 (L) 03/02/2022     Return in about 3 weeks (around 05/25/2022) for For Weight Mangement with Dr. Rikki Spearing.Marland Kitchen She was informed of the importance of frequent follow up visits to maximize her success with intensive lifestyle modifications for her multiple health  conditions.   ATTESTASTION STATEMENTS:  Reviewed by clinician on day of visit: allergies, medications, problem list, medical history, surgical history, family history, social history, and previous encounter notes.     Worthy Rancher, MD

## 2022-05-26 ENCOUNTER — Ambulatory Visit (INDEPENDENT_AMBULATORY_CARE_PROVIDER_SITE_OTHER): Payer: Medicaid Other | Admitting: Internal Medicine

## 2022-06-07 ENCOUNTER — Other Ambulatory Visit (INDEPENDENT_AMBULATORY_CARE_PROVIDER_SITE_OTHER): Payer: Self-pay | Admitting: Internal Medicine

## 2022-06-07 DIAGNOSIS — E1169 Type 2 diabetes mellitus with other specified complication: Secondary | ICD-10-CM

## 2022-06-10 ENCOUNTER — Other Ambulatory Visit (INDEPENDENT_AMBULATORY_CARE_PROVIDER_SITE_OTHER): Payer: Self-pay | Admitting: Internal Medicine

## 2022-06-10 DIAGNOSIS — E1169 Type 2 diabetes mellitus with other specified complication: Secondary | ICD-10-CM

## 2022-06-14 ENCOUNTER — Other Ambulatory Visit (INDEPENDENT_AMBULATORY_CARE_PROVIDER_SITE_OTHER): Payer: Self-pay | Admitting: Internal Medicine

## 2022-06-14 DIAGNOSIS — E559 Vitamin D deficiency, unspecified: Secondary | ICD-10-CM

## 2022-06-20 ENCOUNTER — Ambulatory Visit (INDEPENDENT_AMBULATORY_CARE_PROVIDER_SITE_OTHER): Payer: Medicaid Other | Admitting: Internal Medicine

## 2022-06-20 ENCOUNTER — Encounter (INDEPENDENT_AMBULATORY_CARE_PROVIDER_SITE_OTHER): Payer: Self-pay | Admitting: Internal Medicine

## 2022-06-20 VITALS — BP 110/77 | HR 98 | Temp 98.3°F | Ht 67.0 in | Wt 259.0 lb

## 2022-06-20 DIAGNOSIS — Z7985 Long-term (current) use of injectable non-insulin antidiabetic drugs: Secondary | ICD-10-CM

## 2022-06-20 DIAGNOSIS — E669 Obesity, unspecified: Secondary | ICD-10-CM

## 2022-06-20 DIAGNOSIS — E1169 Type 2 diabetes mellitus with other specified complication: Secondary | ICD-10-CM

## 2022-06-20 DIAGNOSIS — E559 Vitamin D deficiency, unspecified: Secondary | ICD-10-CM | POA: Diagnosis not present

## 2022-06-20 DIAGNOSIS — Z6841 Body Mass Index (BMI) 40.0 and over, adult: Secondary | ICD-10-CM

## 2022-06-20 MED ORDER — OZEMPIC (2 MG/DOSE) 8 MG/3ML ~~LOC~~ SOPN: 2.0000 mg | PEN_INJECTOR | SUBCUTANEOUS | 0 refills | Status: DC

## 2022-06-20 NOTE — Assessment & Plan Note (Signed)
Patient has lost 14% of baseline body weight.  Her blood fat percentage has gone down from 50% to 43%.  Her muscle mass is preserved.  She will continue with nutrition and behavioral strategies and incretin therapy.

## 2022-06-20 NOTE — Progress Notes (Signed)
Office: [phone]  /  Fax: [phone]  WEIGHT SUMMARY AND BIOMETRICS  Vitals Temp: 98.3 F (36.8 C) BP: 110/77 Pulse Rate: 98 SpO2: 97 %   Anthropometric Measurements Height: 5\' 7"  (1.702 m) Weight: 259 lb (117.5 kg) BMI (Calculated): 40.56 Weight at Last Visit: 263 lb Weight Lost Since Last Visit: 4 lb Starting Weight: 175 lb Total Weight Loss (lbs): 16 lb (7.258 kg) Peak Weight: 300 lb   Body Composition  Body Fat %: 43.7 % Fat Mass (lbs): 113.4 lbs Muscle Mass (lbs): 138.6 lbs Total Body Water (lbs): 94.8 lbs Visceral Fat Rating : 13    No data recorded Today's Visit #: 5  Starting Date: 03/02/22   HPI  Chief Complaint: OBESITY  [name] [name] is here to discuss her progress with her obesity treatment plan. She is on the the Category 3 Plan and states she is following her eating plan approximately 80-85 % of the time. She states she is exercising 60+ minutes 6 times per week.  Interval History:  Since last office visit she has lost 4 lbs. She reports good adherence to reduced calorie nutritional plan. She has been working on reading food labels, not skipping meals, increasing protein intake at every meal, eating more fruits, eating more vegetables, drinking more water, avoiding and or reducing liquid calories, making healthier choices, begun to exercise, reducing portion sizes, mindfulness around eating, and controlling orexigenic cues and stimuli  Orixegenic Control: Denies problems with appetite and hunger signals.  Denies problems with satiety and satiation.  Denies problems with eating patterns and portion control.  Denies abnormal cravings. Denies feeling deprived or restricted.   Barriers identified: orthopedic problems, medical conditions or chronic pain affecting mobility.   Pharmacotherapy for weight loss: She is currently taking Ozempic with diabetes as the primary indication with adequate clinical response  and without side effects..     ASSESSMENT AND PLAN  TREATMENT PLAN FOR OBESITY:  Recommended Dietary Goals  Carline is currently in the action stage of change. As such, her goal is to continue weight management plan. She has agreed to: continue current plan  Behavioral Intervention  We discussed the following Behavioral Modification Strategies today: increasing lean protein intake, decreasing simple carbohydrates , increasing vegetables, increasing lower glycemic fruits, increasing fiber rich foods, increasing water intake, continue to practice mindfulness when eating, and planning for success.  Additional resources provided today: None  Recommended Physical Activity Goals  Jordain has been advised to work up to 150 minutes of moderate intensity aerobic activity a week and strengthening exercises 2-3 times per week for cardiovascular health, weight loss maintenance and preservation of muscle mass.   She has agreed to :  Continue current level of physical activity  and Start strengthening exercises with a goal of 2-3 sessions a week   Pharmacotherapy We discussed various medication options to help Disa with her weight loss efforts and we both agreed to : continue with nutritional and behavioral strategies and semaglutide 2 mg once a week with the primary indication of diabetes.  ASSOCIATED CONDITIONS ADDRESSED TODAY  Type 2 diabetes mellitus with other specified complication, without long-term current use of insulin (HCC) Assessment & Plan: Diabetes is well-controlled.  She is currently on semaglutide 2 mg once a week without any adverse effects.    She is maintaining a bowel regimen and denies problems with constipation.  No reports of hypoglycemia.  Continues to work on nutritional and behavioral strategies for weight loss.  We will check hemoglobin A1c and urine  microalbumin today.  Orders: -     Hemoglobin A1c -     Microalbumin / creatinine urine ratio -     Ozempic (2 MG/DOSE); Inject 2 mg into the skin once  a week.  Dispense: 3 mL; Refill: 0  Vitamin D deficiency Assessment & Plan: Most recent vitamin D levels  Lab Results  Component Value Date   VD25OH 26.9 (L) 03/02/2022     Deficiency state associated with adiposity and may result in leptin resistance, weight gain and fatigue.   Plan: We will check vitamin D levels today.   Orders: -     VITAMIN D 25 Hydroxy (Vit-D Deficiency, Fractures)  Obesity with current BMI of 40.5 Assessment & Plan: Patient has lost 14% of baseline body weight.  Her blood fat percentage has gone down from 50% to 43%.  Her muscle mass is preserved.  She will continue with nutrition and behavioral strategies and incretin therapy.     PHYSICAL EXAM:  Blood pressure 110/77, pulse 98, temperature 98.3 F (36.8 C), height 5\' 7"  (1.702 m), weight 259 lb (117.5 kg), SpO2 97 %. Body mass index is 40.57 kg/m.  General: She is overweight, cooperative, alert, well developed, and in no acute distress. PSYCH: Has normal mood, affect and thought process.   HEENT: EOMI, sclerae are anicteric. Lungs: Normal breathing effort, no conversational dyspnea. Extremities: No edema.  Neurologic: No gross sensory or motor deficits. No tremors or fasciculations noted.    DIAGNOSTIC DATA REVIEWED:  BMET    Component Value Date/Time   NA 139 03/02/2022 1034   K 4.6 03/02/2022 1034   CL 104 03/02/2022 1034   CO2 19 (L) 03/02/2022 1034   GLUCOSE 90 03/02/2022 1034   GLUCOSE 101 (H) 07/10/2015 1552   BUN 9 03/02/2022 1034   CREATININE 0.74 03/02/2022 1034   CALCIUM 9.8 03/02/2022 1034   GFRNONAA >60 07/10/2015 1552   GFRAA >60 07/10/2015 1552   Lab Results  Component Value Date   HGBA1C 5.8 (H) 03/02/2022   Lab Results  Component Value Date   INSULIN 23.8 03/02/2022   Lab Results  Component Value Date   TSH 1.330 03/02/2022   CBC    Component Value Date/Time   WBC 5.5 03/02/2022 1034   WBC 6.8 07/10/2015 1552   RBC 4.91 03/02/2022 1034   RBC 4.73  07/10/2015 1552   HGB 13.3 03/02/2022 1034   HCT 41.9 03/02/2022 1034   PLT 360 03/02/2022 1034   MCV 85 03/02/2022 1034   MCH 27.1 03/02/2022 1034   MCH 26.6 07/10/2015 1552   MCHC 31.7 03/02/2022 1034   MCHC 32.4 07/10/2015 1552   RDW 13.3 03/02/2022 1034   Iron Studies No results found for: "IRON", "TIBC", "FERRITIN", "IRONPCTSAT" Lipid Panel     Component Value Date/Time   CHOL 139 03/02/2022 1034   TRIG 93 03/02/2022 1034   HDL 39 (L) 03/02/2022 1034   LDLCALC 82 03/02/2022 1034   Hepatic Function Panel     Component Value Date/Time   PROT 7.4 03/02/2022 1034   ALBUMIN 4.7 03/02/2022 1034   AST 22 03/02/2022 1034   ALT 21 03/02/2022 1034   ALKPHOS 69 03/02/2022 1034   BILITOT 0.3 03/02/2022 1034      Component Value Date/Time   TSH 1.330 03/02/2022 1034   Nutritional Lab Results  Component Value Date   VD25OH 26.9 (L) 03/02/2022     Return in about 3 weeks (around 07/11/2022) for For Weight Mangement with Dr.  Prajna Vanderpool.Marland Kitchen She was informed of the importance of frequent follow up visits to maximize her success with intensive lifestyle modifications for her multiple health conditions.   ATTESTASTION STATEMENTS:  Reviewed by clinician on day of visit: allergies, medications, problem list, medical history, surgical history, family history, social history, and previous encounter notes.     Worthy Rancher, MD

## 2022-06-20 NOTE — Assessment & Plan Note (Signed)
Most recent vitamin D levels  Lab Results  Component Value Date   VD25OH 26.9 (L) 03/02/2022     Deficiency state associated with adiposity and may result in leptin resistance, weight gain and fatigue.   Plan: We will check vitamin D levels today.

## 2022-06-20 NOTE — Assessment & Plan Note (Addendum)
Diabetes is well-controlled.  She is currently on semaglutide 2 mg once a week without any adverse effects.    She is maintaining a bowel regimen and denies problems with constipation.  No reports of hypoglycemia.  Continues to work on nutritional and behavioral strategies for weight loss.  We will check hemoglobin A1c and urine microalbumin today.

## 2022-06-21 LAB — MICROALBUMIN / CREATININE URINE RATIO
Creatinine, Urine: 257.5 mg/dL
Microalb/Creat Ratio: 22 mg/g creat (ref 0–29)
Microalbumin, Urine: 57.9 ug/mL

## 2022-06-21 LAB — HEMOGLOBIN A1C
Est. average glucose Bld gHb Est-mCnc: 123 mg/dL
Hgb A1c MFr Bld: 5.9 % — ABNORMAL HIGH (ref 4.8–5.6)

## 2022-06-21 LAB — VITAMIN D 25 HYDROXY (VIT D DEFICIENCY, FRACTURES): Vit D, 25-Hydroxy: 31.4 ng/mL (ref 30.0–100.0)

## 2022-07-01 ENCOUNTER — Other Ambulatory Visit (INDEPENDENT_AMBULATORY_CARE_PROVIDER_SITE_OTHER): Payer: Self-pay | Admitting: Internal Medicine

## 2022-07-01 DIAGNOSIS — K5903 Drug induced constipation: Secondary | ICD-10-CM

## 2022-07-18 ENCOUNTER — Other Ambulatory Visit (INDEPENDENT_AMBULATORY_CARE_PROVIDER_SITE_OTHER): Payer: Self-pay | Admitting: Internal Medicine

## 2022-07-18 DIAGNOSIS — E1169 Type 2 diabetes mellitus with other specified complication: Secondary | ICD-10-CM

## 2022-07-21 ENCOUNTER — Encounter (INDEPENDENT_AMBULATORY_CARE_PROVIDER_SITE_OTHER): Payer: Self-pay | Admitting: Internal Medicine

## 2022-07-21 ENCOUNTER — Ambulatory Visit (INDEPENDENT_AMBULATORY_CARE_PROVIDER_SITE_OTHER): Payer: Medicaid Other | Admitting: Internal Medicine

## 2022-07-21 VITALS — BP 125/70 | HR 86 | Temp 98.7°F | Ht 67.0 in | Wt 263.0 lb

## 2022-07-21 DIAGNOSIS — E559 Vitamin D deficiency, unspecified: Secondary | ICD-10-CM | POA: Diagnosis not present

## 2022-07-21 DIAGNOSIS — Z6841 Body Mass Index (BMI) 40.0 and over, adult: Secondary | ICD-10-CM

## 2022-07-21 DIAGNOSIS — R29818 Other symptoms and signs involving the nervous system: Secondary | ICD-10-CM | POA: Diagnosis not present

## 2022-07-21 DIAGNOSIS — E1169 Type 2 diabetes mellitus with other specified complication: Secondary | ICD-10-CM | POA: Diagnosis not present

## 2022-07-21 DIAGNOSIS — E669 Obesity, unspecified: Secondary | ICD-10-CM

## 2022-07-21 DIAGNOSIS — Z7985 Long-term (current) use of injectable non-insulin antidiabetic drugs: Secondary | ICD-10-CM

## 2022-07-21 MED ORDER — OZEMPIC (2 MG/DOSE) 8 MG/3ML ~~LOC~~ SOPN: 2.0000 mg | PEN_INJECTOR | SUBCUTANEOUS | 0 refills | Status: DC

## 2022-07-21 NOTE — Assessment & Plan Note (Signed)
Patient has symptoms of sleep disordered breathing and high risk phenotype.  Her neck size is 17 inches and she has a Mallampati of 4.  Counseled on risks associated with undiagnosed / untreated disorder.  She was referred for sleep study last office visit but has not schedule sleep study.  Losing 15% of BW may improve symptoms.  She will follow-up with [facility]

## 2022-07-21 NOTE — Progress Notes (Signed)
Office: [phone]  /  Fax: [phone]  WEIGHT SUMMARY AND BIOMETRICS  Vitals Temp: 98.7 F (37.1 C) BP: 125/70 Pulse Rate: 86 SpO2: 97 %   Anthropometric Measurements Height: 5\' 7"  (1.702 m) Weight: 263 lb (119.3 kg) BMI (Calculated): 41.18 Weight at Last Visit: 259 lb Weight Lost Since Last Visit: 0 Weight Gained Since Last Visit: 4 lb Starting Weight: 275 lb Total Weight Loss (lbs): 12 lb (5.443 kg) Peak Weight: 300 lb   Body Composition  Body Fat %: 42.9 % Fat Mass (lbs): 113 lbs Muscle Mass (lbs): 143 lbs Total Body Water (lbs): 97.2 lbs Visceral Fat Rating : 13    No data recorded Today's Visit #: 6  Starting Date: 03/02/22   HPI  Chief Complaint: OBESITY  [name] [name] is here to discuss her progress with her obesity treatment plan. She is on the the Category 3 Plan and states she is following her eating plan approximately 80 % of the time. She states she is exercising swimming, running in the pool 2 hours 7 times per week.  Interval History:  Since last office visit she has gained 4 lbs. Notes decreased PA levels. She reports good adherence to reduced calorie nutritional plan. Has noted increases in cravings, has not been doing the powerbowl.   Orixegenic Control: Denies problems with appetite and hunger signals.  Denies problems with satiety and satiation.  Reports problems with eating patterns and portion control.  Reports abnormal cravings with cycle. At times uncontrollable. Denies feeling deprived or restricted.   Barriers identified: strong hunger signals and appetite and orthopedic problems, medical conditions or chronic pain affecting mobility.   Pharmacotherapy for weight loss: She is currently taking Ozempic with diabetes as the primary indication with adequate clinical response  and without side effects..    ASSESSMENT AND PLAN  TREATMENT PLAN FOR OBESITY:  Recommended Dietary Goals  Zina is currently in the action stage of change.  As such, her goal is to continue weight management plan. She has agreed to: continue current plan  Behavioral Intervention  We discussed the following Behavioral Modification Strategies today: increasing lean protein intake, decreasing simple carbohydrates , increasing vegetables, increasing lower glycemic fruits, increasing fiber rich foods, increasing water intake, continue to practice mindfulness when eating, and planning for success.  Additional resources provided today: Handout and personalized instruction on tracking and journaling using Apps  Recommended Physical Activity Goals  Taisia has been advised to work up to 150 minutes of moderate intensity aerobic activity a week and strengthening exercises 2-3 times per week for cardiovascular health, weight loss maintenance and preservation of muscle mass.   She has agreed to :  Think about ways to increase daily physical activity and overcoming barriers to exercise  Pharmacotherapy We discussed various medication options to help Daveda with her weight loss efforts and we both agreed to : continue current anti-obesity medication regimen  ASSOCIATED CONDITIONS ADDRESSED TODAY  Vitamin D deficiency Assessment & Plan: Reviewed most recent vitamin D levels and they have improved but I will like to see her levels closer to 50-60.  She will increase her vitamin D3 to 2000 units alternating with 4000 units every other day.   Type 2 diabetes mellitus with other specified complication, without long-term current use of insulin (HCC) Assessment & Plan: HgbA1c is at goal for age and comorbid conditions. Denies symptoms of hypoglycemia or hyperglycemia. On semaglutide 2 mg once a day with good adherence and no side effects.    Lab Results  Component Value Date   HGBA1C 5.9 (H) 06/20/2022   HGBA1C 5.8 (H) 03/02/2022   Lab Results  Component Value Date   LDLCALC 82 03/02/2022   CREATININE 0.74 03/02/2022   She will continue on incretin  therapy at current dose.  Has also been advised to reduce simple and added sugars from her diet.   Orders: -     Ozempic (2 MG/DOSE); Inject 2 mg into the skin once a week.  Dispense: 3 mL; Refill: 0  Suspected sleep apnea Assessment & Plan: Patient has symptoms of sleep disordered breathing and high risk phenotype.  Her neck size is 17 inches and she has a Mallampati of 4.  Counseled on risks associated with undiagnosed / untreated disorder.  She was referred for sleep study last office visit but has not schedule sleep study.  Losing 15% of BW may improve symptoms.  She will follow-up with [facility]     PHYSICAL EXAM:  Blood pressure 125/70, pulse 86, temperature 98.7 F (37.1 C), height 5\' 7"  (1.702 m), weight 263 lb (119.3 kg), SpO2 97%. Body mass index is 41.19 kg/m.  General: She is overweight, cooperative, alert, well developed, and in no acute distress. PSYCH: Has normal mood, affect and thought process.   HEENT: EOMI, sclerae are anicteric. Lungs: Normal breathing effort, no conversational dyspnea. Extremities: No edema.  Neurologic: No gross sensory or motor deficits. No tremors or fasciculations noted.    DIAGNOSTIC DATA REVIEWED:  BMET    Component Value Date/Time   NA 139 03/02/2022 1034   K 4.6 03/02/2022 1034   CL 104 03/02/2022 1034   CO2 19 (L) 03/02/2022 1034   GLUCOSE 90 03/02/2022 1034   GLUCOSE 101 (H) 07/10/2015 1552   BUN 9 03/02/2022 1034   CREATININE 0.74 03/02/2022 1034   CALCIUM 9.8 03/02/2022 1034   GFRNONAA >60 07/10/2015 1552   GFRAA >60 07/10/2015 1552   Lab Results  Component Value Date   HGBA1C 5.9 (H) 06/20/2022   HGBA1C 5.8 (H) 03/02/2022   Lab Results  Component Value Date   INSULIN 23.8 03/02/2022   Lab Results  Component Value Date   TSH 1.330 03/02/2022   CBC    Component Value Date/Time   WBC 5.5 03/02/2022 1034   WBC 6.8 07/10/2015 1552   RBC 4.91 03/02/2022 1034   RBC 4.73 07/10/2015 1552    HGB 13.3 03/02/2022 1034   HCT 41.9 03/02/2022 1034   PLT 360 03/02/2022 1034   MCV 85 03/02/2022 1034   MCH 27.1 03/02/2022 1034   MCH 26.6 07/10/2015 1552   MCHC 31.7 03/02/2022 1034   MCHC 32.4 07/10/2015 1552   RDW 13.3 03/02/2022 1034   Iron Studies No results found for: "IRON", "TIBC", "FERRITIN", "IRONPCTSAT" Lipid Panel     Component Value Date/Time   CHOL 139 03/02/2022 1034   TRIG 93 03/02/2022 1034   HDL 39 (L) 03/02/2022 1034   LDLCALC 82 03/02/2022 1034   Hepatic Function Panel     Component Value Date/Time   PROT 7.4 03/02/2022 1034   ALBUMIN 4.7 03/02/2022 1034   AST 22 03/02/2022 1034   ALT 21 03/02/2022 1034   ALKPHOS 69 03/02/2022 1034   BILITOT 0.3 03/02/2022 1034      Component Value Date/Time   TSH 1.330 03/02/2022 1034   Nutritional Lab Results  Component Value Date   VD25OH 31.4 06/20/2022   VD25OH 26.9 (L) 03/02/2022     Return in about 3 weeks (around  08/11/2022) for For Weight Mangement with Dr. Rikki Spearing.Marland Kitchen She was informed of the importance of frequent follow up visits to maximize her success with intensive lifestyle modifications for her multiple health conditions.   ATTESTASTION STATEMENTS:  Reviewed by clinician on day of visit: allergies, medications, problem list, medical history, surgical history, family history, social history, and previous encounter notes.     Worthy Rancher, MD

## 2022-07-21 NOTE — Assessment & Plan Note (Signed)
Reviewed most recent vitamin D levels and they have improved but I will like to see her levels closer to 50-60.  She will increase her vitamin D3 to 2000 units alternating with 4000 units every other day.

## 2022-07-21 NOTE — Assessment & Plan Note (Signed)
HgbA1c is at goal for age and comorbid conditions. Denies symptoms of hypoglycemia or hyperglycemia. On semaglutide 2 mg once a day with good adherence and no side effects.    Lab Results  Component Value Date   HGBA1C 5.9 (H) 06/20/2022   HGBA1C 5.8 (H) 03/02/2022   Lab Results  Component Value Date   LDLCALC 82 03/02/2022   CREATININE 0.74 03/02/2022   She will continue on incretin therapy at current dose.  Has also been advised to reduce simple and added sugars from her diet.

## 2022-08-17 DIAGNOSIS — R070 Pain in throat: Secondary | ICD-10-CM | POA: Insufficient documentation

## 2022-08-17 DIAGNOSIS — R051 Acute cough: Secondary | ICD-10-CM | POA: Insufficient documentation

## 2022-08-25 ENCOUNTER — Encounter (INDEPENDENT_AMBULATORY_CARE_PROVIDER_SITE_OTHER): Payer: Self-pay | Admitting: Internal Medicine

## 2022-08-25 ENCOUNTER — Ambulatory Visit (INDEPENDENT_AMBULATORY_CARE_PROVIDER_SITE_OTHER): Payer: Medicaid Other | Admitting: Internal Medicine

## 2022-08-25 VITALS — BP 93/62 | HR 96 | Temp 98.2°F | Ht 67.0 in | Wt 258.0 lb

## 2022-08-25 DIAGNOSIS — R29818 Other symptoms and signs involving the nervous system: Secondary | ICD-10-CM

## 2022-08-25 DIAGNOSIS — E1169 Type 2 diabetes mellitus with other specified complication: Secondary | ICD-10-CM | POA: Diagnosis not present

## 2022-08-25 DIAGNOSIS — Z7985 Long-term (current) use of injectable non-insulin antidiabetic drugs: Secondary | ICD-10-CM

## 2022-08-25 MED ORDER — OZEMPIC (2 MG/DOSE) 8 MG/3ML ~~LOC~~ SOPN: 2.0000 mg | PEN_INJECTOR | SUBCUTANEOUS | 0 refills | Status: DC

## 2022-08-25 NOTE — Assessment & Plan Note (Signed)
 Reviewed most recent vitamin D levels and they have improved but I will like to see her levels closer to 50-60.  She will increase her vitamin D3 to 2000 units alternating with 4000 units every other day.

## 2022-08-25 NOTE — Assessment & Plan Note (Signed)
HgbA1c is at goal for age and comorbid conditions. Denies symptoms of hypoglycemia or hyperglycemia. On semaglutide 2 mg once a day with good adherence and no side effects.    Lab Results  Component Value Date   HGBA1C 5.9 (H) 06/20/2022   HGBA1C 5.8 (H) 03/02/2022   Lab Results  Component Value Date   LDLCALC 82 03/02/2022   CREATININE 0.74 03/02/2022   She will continue on incretin therapy at current dose.  Patient advised to eat smaller portions and more frequent meals because of the bloating.  We may also consider reducing Ozempic to 1.5 mg a week.

## 2022-08-25 NOTE — Progress Notes (Signed)
Office: [phone]  /  Fax: [phone]  WEIGHT SUMMARY AND BIOMETRICS  Vitals Temp: 98.2 F (36.8 C) BP: 93/62 Pulse Rate: 96 SpO2: 97 %   Anthropometric Measurements Height: 5\' 7"  (1.702 m) Weight: 258 lb (117 kg) BMI (Calculated): 40.4 Weight at Last Visit: 263lb Weight Lost Since Last Visit: 5lb Weight Gained Since Last Visit: 0 Starting Weight: 275lb Total Weight Loss (lbs): 17 lb (7.711 kg) Peak Weight: 300lb   Body Composition  Body Fat %: 42.4 % Fat Mass (lbs): 109.6 lbs Muscle Mass (lbs): 141.6 lbs Total Body Water (lbs): 93.2 lbs Visceral Fat Rating : 13    No data recorded Today's Visit #: 7  Starting Date: 03/02/22   HPI  Chief Complaint: OBESITY  [name] [name] is here to discuss her progress with her obesity treatment plan. She is on the the Category 3 Plan and states she is following her eating plan approximately 80 % of the time. She states she is exercising 60 minutes 3 times per week.  Interval History:  Since last office visit she has lost 5 pounds she is somewhat dissatisfied by the rate of weight loss but her BIA shows a significant reduction in body fat percentage and also gains in muscle mass.  She reports adequate appetite control and satiety on Ozempic 2 mg once a week.  She has been experiencing some bloating and had a few spells of what she thinks is low blood sugar.  She did not check her blood sugars and these were preceded by foods that were simple carbs.    Barriers identified: orthopedic problems, medical conditions or chronic pain affecting mobility.   Pharmacotherapy for weight loss: She is currently taking Ozempic with diabetes as the primary indication with adequate clinical response  and experiencing the following side effects: Postprandial bloating..    ASSESSMENT AND PLAN  TREATMENT PLAN FOR OBESITY:  Recommended Dietary Goals  [name] [name] is currently in the action stage of change. As such, her goal is to continue weight  management plan. She has agreed to: continue current plan  Behavioral Intervention  We discussed the following Behavioral Modification Strategies today: increasing lean protein intake, decreasing simple carbohydrates , increasing vegetables, increasing lower glycemic fruits, increasing water intake, continue to practice mindfulness when eating, and planning for success.  Additional resources provided today: None  Recommended Physical Activity Goals  [name] [name] has been advised to work up to 150 minutes of moderate intensity aerobic activity a week and strengthening exercises 2-3 times per week for cardiovascular health, weight loss maintenance and preservation of muscle mass.   She has agreed to :  Think about ways to increase daily physical activity and overcoming barriers to exercise and Increase physical activity in their day and reduce sedentary time (increase NEAT).  Pharmacotherapy We discussed various medication options to help [name] [name] with her weight loss efforts and we both agreed to : continue current anti-obesity medication regimen  ASSOCIATED CONDITIONS ADDRESSED TODAY  Type 2 diabetes mellitus with other specified complication, without long-term current use of insulin (HCC) Assessment & Plan: HgbA1c is at goal for age and comorbid conditions. Denies symptoms of hypoglycemia or hyperglycemia. On semaglutide 2 mg once a day with good adherence and no side effects.    Lab Results  Component Value Date   HGBA1C 5.9 (H) 06/20/2022   HGBA1C 5.8 (H) 03/02/2022   Lab Results  Component Value Date   LDLCALC 82 03/02/2022   CREATININE 0.74 03/02/2022   She will continue on incretin therapy  at current dose.  Patient advised to eat smaller portions and more frequent meals because of the bloating.  We may also consider reducing Ozempic to 1.5 mg a week.   Orders: -     Ozempic (2 MG/DOSE); Inject 2 mg into the skin once a week.  Dispense: 3 mL; Refill: 0  Suspected sleep  apnea Assessment & Plan: Patient has symptoms of sleep disordered breathing and high risk phenotype.  Her neck size is 17 inches and she has a Mallampati of 4.  Counseled on risks associated with undiagnosed / untreated disorder.    Sleep study has been scheduled.    Losing 15% of BW may improve symptoms.  She will follow-up with [facility]     PHYSICAL EXAM:  Blood pressure 93/62, pulse 96, temperature 98.2 F (36.8 C), height 5\' 7"  (1.702 m), weight 258 lb (117 kg), SpO2 97%. Body mass index is 40.41 kg/m.  General: She is overweight, cooperative, alert, well developed, and in no acute distress. PSYCH: Has normal mood, affect and thought process.   HEENT: EOMI, sclerae are anicteric. Lungs: Normal breathing effort, no conversational dyspnea. Extremities: No edema.  Neurologic: No gross sensory or motor deficits. No tremors or fasciculations noted.    DIAGNOSTIC DATA REVIEWED:  BMET    Component Value Date/Time   NA 139 03/02/2022 1034   K 4.6 03/02/2022 1034   CL 104 03/02/2022 1034   CO2 19 (L) 03/02/2022 1034   GLUCOSE 90 03/02/2022 1034   GLUCOSE 101 (H) 07/10/2015 1552   BUN 9 03/02/2022 1034   CREATININE 0.74 03/02/2022 1034   CALCIUM 9.8 03/02/2022 1034   GFRNONAA >60 07/10/2015 1552   GFRAA >60 07/10/2015 1552   Lab Results  Component Value Date   HGBA1C 5.9 (H) 06/20/2022   HGBA1C 5.8 (H) 03/02/2022   Lab Results  Component Value Date   INSULIN 23.8 03/02/2022   Lab Results  Component Value Date   TSH 1.330 03/02/2022   CBC    Component Value Date/Time   WBC 5.5 03/02/2022 1034   WBC 6.8 07/10/2015 1552   RBC 4.91 03/02/2022 1034   RBC 4.73 07/10/2015 1552   HGB 13.3 03/02/2022 1034   HCT 41.9 03/02/2022 1034   PLT 360 03/02/2022 1034   MCV 85 03/02/2022 1034   MCH 27.1 03/02/2022 1034   MCH 26.6 07/10/2015 1552   MCHC 31.7 03/02/2022 1034   MCHC 32.4 07/10/2015 1552   RDW 13.3 03/02/2022 1034   Iron Studies No  results found for: "IRON", "TIBC", "FERRITIN", "IRONPCTSAT" Lipid Panel     Component Value Date/Time   CHOL 139 03/02/2022 1034   TRIG 93 03/02/2022 1034   HDL 39 (L) 03/02/2022 1034   LDLCALC 82 03/02/2022 1034   Hepatic Function Panel     Component Value Date/Time   PROT 7.4 03/02/2022 1034   ALBUMIN 4.7 03/02/2022 1034   AST 22 03/02/2022 1034   ALT 21 03/02/2022 1034   ALKPHOS 69 03/02/2022 1034   BILITOT 0.3 03/02/2022 1034      Component Value Date/Time   TSH 1.330 03/02/2022 1034   Nutritional Lab Results  Component Value Date   VD25OH 31.4 06/20/2022   VD25OH 26.9 (L) 03/02/2022     Return in about 3 weeks (around 09/15/2022) for For Weight Mangement with Dr. Rikki Spearing.Marland Kitchen She was informed of the importance of frequent follow up visits to maximize her success with intensive lifestyle modifications for her multiple health conditions.  ATTESTASTION STATEMENTS:  Reviewed by clinician on day of visit: allergies, medications, problem list, medical history, surgical history, family history, social history, and previous encounter notes.     Worthy Rancher, MD

## 2022-08-25 NOTE — Assessment & Plan Note (Signed)
Patient has symptoms of sleep disordered breathing and high risk phenotype.  Her neck size is 17 inches and she has a Mallampati of 4.  Counseled on risks associated with undiagnosed / untreated disorder.    Sleep study has been scheduled.    Losing 15% of BW may improve symptoms.  She will follow-up with [facility]

## 2022-08-30 ENCOUNTER — Encounter: Payer: Self-pay | Admitting: Neurology

## 2022-08-30 ENCOUNTER — Telehealth: Payer: Self-pay | Admitting: Neurology

## 2022-08-30 ENCOUNTER — Ambulatory Visit: Payer: Medicaid Other | Admitting: Neurology

## 2022-08-30 VITALS — BP 121/83 | HR 87 | Ht 67.0 in | Wt 262.0 lb

## 2022-08-30 DIAGNOSIS — G4701 Insomnia due to medical condition: Secondary | ICD-10-CM

## 2022-08-30 DIAGNOSIS — G8921 Chronic pain due to trauma: Secondary | ICD-10-CM | POA: Diagnosis not present

## 2022-08-30 DIAGNOSIS — G8929 Other chronic pain: Secondary | ICD-10-CM | POA: Insufficient documentation

## 2022-08-30 DIAGNOSIS — Z6841 Body Mass Index (BMI) 40.0 and over, adult: Secondary | ICD-10-CM

## 2022-08-30 DIAGNOSIS — R29818 Other symptoms and signs involving the nervous system: Secondary | ICD-10-CM | POA: Diagnosis not present

## 2022-08-30 DIAGNOSIS — R0602 Shortness of breath: Secondary | ICD-10-CM

## 2022-08-30 NOTE — Telephone Encounter (Signed)
HST- [facility] pending.
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
## 2022-08-30 NOTE — Patient Instructions (Signed)
Hypersomnia Hypersomnia is a condition in which a person feels very tired during the day even though the person gets plenty of sleep at night. A person with this condition may take naps during the day and may find it very difficult to wake up from sleep. Hypersomnia may affect a person's ability to think, concentrate, drive, or remember things. What are the causes? The cause of this condition may not be known. Possible causes include: Taking certain medicines. Using drugs or alcohol. Sleep disorders, such as narcolepsy and sleep apnea. Injury to the head, brain, or spinal cord. Tumors. Certain medical conditions. These include: Depression. Diabetes. Gastroesophageal reflux disease (GERD). An underactive thyroid gland (hypothyroidism). What are the signs or symptoms? The main symptoms of hypersomnia include: Feeling very tired throughout the day, regardless of how much sleep you got the night before. Having trouble waking up. Others may find it difficult to wake you up when you are sleeping. Sleeping for longer and longer periods at a time. Taking naps throughout the day. Other symptoms may include: Feeling restless, anxious, or annoyed. Lacking energy. Having trouble with: Remembering. Speaking. Thinking. Loss of appetite. Seeing, hearing, tasting, smelling, or feeling things that are not real (hallucinations). How is this diagnosed? This condition may be diagnosed based on: Your symptoms and medical history. Your sleeping habits. Your health care provider may ask you to write down your sleeping habits in a daily sleep log, along with any symptoms you have. A series of tests that are done while you sleep (sleep study or polysomnogram). A test that measures how quickly you can fall asleep during the day (daytime nap study or multiple sleep latency test). How is this treated? This condition may be treated by: Following a regular sleep routine. Making lifestyle changes, such  as changing your eating habits, getting regular exercise, and avoiding alcohol or caffeinated beverages. Taking medicines to make you more alert (stimulants) during the day. Treating any underlying medical causes of hypersomnia. Follow these instructions at home: Sleep habits Stick to a routine that includes going to bed and waking up at the same times every day and night. Practice a relaxing bedtime routine. This may include reading, meditation, deep breathing, or taking a warm bath before going to sleep. Exercise regularly as told by your health care provider. However, avoid exercising in the hours right before bedtime. Keep your sleep environment at a cooler temperature, darkened, and quiet. Sleep with pillows and a mattress that are comfortable and supportive. Schedule short 20-minute naps for when you feel sleepiest during the day. Talk with your employer or teachers about your hypersomnia. If possible, adjust your schedule so that: You have a regular daytime work schedule. You can take a scheduled nap during the day. You do not have to work or be active at night. Do not eat a heavy meal for a few hours before bedtime. Eat your meals at about the same times every day. Safety  Do not drive or use machinery if you are sleepy. Ask your health care provider if it is safe for you to drive. Wear a life jacket when swimming or spending time near water. General instructions  Take over-the-counter and prescription medicines only as told by your health care provider. This includes supplements. Avoid drinking alcohol or caffeinated beverages. Keep a sleep log that will help your health care provider manage your condition. This may include information about: What time you go to bed each night. How often you wake up at night. How  many hours you sleep at night. How often and for how long you nap during the day. Any observations from others, such as leg movements during sleep, sleep walking, or  snoring. Keep all follow-up visits. This is important. Contact a health care provider if: You have new symptoms. Your symptoms get worse. Get help right away if: You have thoughts about hurting yourself or someone else. Get help right away if you feel like you may hurt yourself or others, or have thoughts about taking your own life. Go to your nearest emergency room or: Call 911. Call the National Suicide Prevention Lifeline at 236-612-7690 or 988. This is open 24 hours a day. Text the Crisis Text Line at 571 213 4580. Summary Hypersomnia refers to a condition in which you feel very tired during the day even though you get plenty of sleep at night. A person with this condition may take naps during the day and may find it very difficult to wake up from sleep. Hypersomnia may affect a person's ability to think, concentrate, drive, or remember things. Treatment may include a regular sleep routine and making some lifestyle changes. This information is not intended to replace advice given to you by your health care provider. Make sure you discuss any questions you have with your health care provider. Document Revised: 11/30/2020 Document Reviewed: 11/30/2020 Elsevier Patient Education  2024 Elsevier Inc. ASSESSMENT AND PLAN 52 y.o. year old female  here for a SLEEP MEDICINE CONSULTATION with the goal of evaluating for organic sleep disorders, OSA :     1) years of high fatigue and poor sleep quality, all attributed to chronic pain, and worsened by inactivity and weight gain over the last decade.  She is  followed by neurosurgery and  pain medicine. my role is to evaluate for an organic sleep disorder. Insomnia due to pain is not an organic sleep disorder that we follow in our sleep medicine clinic.    2) she is suffering from anxiety disorder , too   3) high risk of OSA by BMI, by neck size and upper airway anatomy.    I will order a HST to evaluate for OSA, hypoxia or central apnea, in relation  to pain  therapy. The patient is under evaluation for an implanted spinal cord stimulator which is followed by NS and Pain specialist.    I plan to follow up either personally or through our NP within 4-5 months.

## 2022-09-01 NOTE — Telephone Encounter (Signed)
I received this fax from [facility] [employer] do you want to order an in lab?

## 2022-09-01 NOTE — Telephone Encounter (Signed)
Order for in lab study place.

## 2022-09-01 NOTE — Addendum Note (Signed)
Addended by: Berneice Gandy A on: 09/01/2022 12:15 PM   Modules accepted: Orders

## 2022-09-01 NOTE — Telephone Encounter (Signed)
Noted, NPSG pending faxed info.

## 2022-09-01 NOTE — Addendum Note (Signed)
Addended by: Melvyn Novas on: 09/01/2022 04:30 PM   Modules accepted: Orders

## 2022-09-07 NOTE — Telephone Encounter (Signed)
NPSG- [facility] [employer] [facility] Berkley Harvey: [id number] (exp. 09/01/22 to 10/30/22)   Patient is scheduled at [facility] for 11/22/22 at 9 pm.   Mailed packet to the patient.

## 2022-09-07 NOTE — Telephone Encounter (Signed)
[facility] [employer] approved the NPSG first.  NPSG- [facility] [employer] Berkley Harvey: [id number] (exp. 09/01/22 to 10/30/22)

## 2022-09-18 ENCOUNTER — Other Ambulatory Visit (INDEPENDENT_AMBULATORY_CARE_PROVIDER_SITE_OTHER): Payer: Self-pay | Admitting: Internal Medicine

## 2022-09-18 DIAGNOSIS — E1169 Type 2 diabetes mellitus with other specified complication: Secondary | ICD-10-CM

## 2022-09-19 ENCOUNTER — Other Ambulatory Visit (INDEPENDENT_AMBULATORY_CARE_PROVIDER_SITE_OTHER): Payer: Self-pay | Admitting: Internal Medicine

## 2022-09-19 DIAGNOSIS — E1169 Type 2 diabetes mellitus with other specified complication: Secondary | ICD-10-CM

## 2022-09-20 DIAGNOSIS — R2689 Other abnormalities of gait and mobility: Secondary | ICD-10-CM | POA: Insufficient documentation

## 2022-09-20 DIAGNOSIS — R269 Unspecified abnormalities of gait and mobility: Secondary | ICD-10-CM | POA: Insufficient documentation

## 2022-09-21 ENCOUNTER — Encounter (INDEPENDENT_AMBULATORY_CARE_PROVIDER_SITE_OTHER): Payer: Self-pay | Admitting: Family Medicine

## 2022-09-21 ENCOUNTER — Ambulatory Visit (INDEPENDENT_AMBULATORY_CARE_PROVIDER_SITE_OTHER): Payer: Medicaid Other | Admitting: Family Medicine

## 2022-09-21 VITALS — BP 104/73 | HR 86 | Temp 98.1°F | Ht 67.0 in | Wt 262.0 lb

## 2022-09-21 DIAGNOSIS — E669 Obesity, unspecified: Secondary | ICD-10-CM

## 2022-09-21 DIAGNOSIS — Z7985 Long-term (current) use of injectable non-insulin antidiabetic drugs: Secondary | ICD-10-CM

## 2022-09-21 DIAGNOSIS — E1169 Type 2 diabetes mellitus with other specified complication: Secondary | ICD-10-CM | POA: Diagnosis not present

## 2022-09-21 DIAGNOSIS — Z6841 Body Mass Index (BMI) 40.0 and over, adult: Secondary | ICD-10-CM | POA: Diagnosis not present

## 2022-09-21 MED ORDER — TIRZEPATIDE 12.5 MG/0.5ML ~~LOC~~ SOAJ: 12.5000 mg | SUBCUTANEOUS | 0 refills | Status: DC

## 2022-09-22 ENCOUNTER — Telehealth (INDEPENDENT_AMBULATORY_CARE_PROVIDER_SITE_OTHER): Payer: Self-pay | Admitting: *Deleted

## 2022-09-22 ENCOUNTER — Other Ambulatory Visit (INDEPENDENT_AMBULATORY_CARE_PROVIDER_SITE_OTHER): Payer: Self-pay | Admitting: Family Medicine

## 2022-09-22 DIAGNOSIS — E1169 Type 2 diabetes mellitus with other specified complication: Secondary | ICD-10-CM

## 2022-09-22 NOTE — Telephone Encounter (Signed)
PA SUBMITTED VIA COVERMYMEDS.  Outcome Approved today by [facility] Medicaid [place] Case: [id number], Status: Approved, Coverage Starts on: 09/22/2022 12:00:00 AM, Coverage Ends on: 09/22/2023 12:00:00 AM. Authorization Expiration Date: 09/21/2023  Patient has been notified via Mychart.

## 2022-09-22 NOTE — Progress Notes (Signed)
Chief Complaint:   OBESITY [name] [name] is here to discuss her progress with her obesity treatment plan along with follow-up of her obesity related diagnoses. [name] [name] is on the Category 3 Plan and states she is following her eating plan approximately 75% of the time. [name] [name] states she is doing 0 minutes 0 times per week.  Today's visit was #: 8 Starting weight: 275 lbs Starting date: 03/02/2022 Today's weight: 262 lbs Today's date: 09/21/2022 Total lbs lost to date: 13 Total lbs lost since last in-office visit: 0  Interim History: Patient had a fall recently and has not been able to exercise while healing.  She notes her hunger has started to increase and her weight loss has stalled.  Subjective:   1. Type 2 diabetes mellitus with other specified complication, without long-term current use of insulin (HCC) Patient is on Ozempic but she still notes some polyphagia.  She denies nausea, vomiting, or hypoglycemia.  She does not tolerate metformin.  Assessment/Plan:   1. Type 2 diabetes mellitus with other specified complication, without long-term current use of insulin (HCC) Patient agreed to discontinue Ozempic, and start Mounjaro 12.5 mg once weekly with no refills.  We will follow-up at her next visit in 1 month.  - tirzepatide (MOUNJARO) 12.5 MG/0.5ML Pen; Inject 12.5 mg into the skin once a week.  Dispense: 2 mL; Refill: 0  2. BMI 40.0-44.9, adult (HCC)  3. Obesity, Beginning BMI 43.07 [name] [name] is currently in the action stage of change. As such, her goal is to continue with weight loss efforts. She has agreed to the Category 3 Plan.   Behavioral modification strategies: increasing lean protein intake and no skipping meals.  [name] has agreed to follow-up with our clinic in 4 weeks. She was informed of the importance of frequent follow-up visits to maximize her success with intensive lifestyle modifications for her multiple health conditions.   Objective:   Blood pressure 104/73, pulse 86,  temperature 98.1 F (36.7 C), height 5\' 7"  (1.702 m), weight 262 lb (118.8 kg), SpO2 98%. Body mass index is 41.04 kg/m.  Lab Results  Component Value Date   CREATININE 0.74 03/02/2022   BUN 9 03/02/2022   NA 139 03/02/2022   K 4.6 03/02/2022   CL 104 03/02/2022   CO2 19 (L) 03/02/2022   Lab Results  Component Value Date   ALT 21 03/02/2022   AST 22 03/02/2022   ALKPHOS 69 03/02/2022   BILITOT 0.3 03/02/2022   Lab Results  Component Value Date   HGBA1C 5.9 (H) 06/20/2022   HGBA1C 5.8 (H) 03/02/2022   Lab Results  Component Value Date   INSULIN 23.8 03/02/2022   Lab Results  Component Value Date   TSH 1.330 03/02/2022   Lab Results  Component Value Date   CHOL 139 03/02/2022   HDL 39 (L) 03/02/2022   LDLCALC 82 03/02/2022   TRIG 93 03/02/2022   Lab Results  Component Value Date   VD25OH 31.4 06/20/2022   VD25OH 26.9 (L) 03/02/2022   Lab Results  Component Value Date   WBC 5.5 03/02/2022   HGB 13.3 03/02/2022   HCT 41.9 03/02/2022   MCV 85 03/02/2022   PLT 360 03/02/2022   No results found for: "IRON", "TIBC", "FERRITIN"  Attestation Statements:   Reviewed by clinician on day of visit: allergies, medications, problem list, medical history, surgical history, family history, social history, and previous encounter notes.   Trude Mcburney, am acting as Energy manager for [employer],  MD.  I have reviewed the above documentation for accuracy and completeness, and I agree with the above. -  Quillian Quince, MD

## 2022-10-06 ENCOUNTER — Ambulatory Visit (INDEPENDENT_AMBULATORY_CARE_PROVIDER_SITE_OTHER): Payer: Medicaid Other | Admitting: Internal Medicine

## 2022-10-19 ENCOUNTER — Other Ambulatory Visit (INDEPENDENT_AMBULATORY_CARE_PROVIDER_SITE_OTHER): Payer: Self-pay | Admitting: Internal Medicine

## 2022-10-19 ENCOUNTER — Encounter (INDEPENDENT_AMBULATORY_CARE_PROVIDER_SITE_OTHER): Payer: Self-pay | Admitting: Internal Medicine

## 2022-10-19 ENCOUNTER — Ambulatory Visit (INDEPENDENT_AMBULATORY_CARE_PROVIDER_SITE_OTHER): Payer: Medicaid Other | Admitting: Internal Medicine

## 2022-10-19 VITALS — BP 93/70 | HR 97 | Temp 98.6°F | Ht 67.0 in | Wt 255.0 lb

## 2022-10-19 DIAGNOSIS — E1169 Type 2 diabetes mellitus with other specified complication: Secondary | ICD-10-CM | POA: Diagnosis not present

## 2022-10-19 DIAGNOSIS — E559 Vitamin D deficiency, unspecified: Secondary | ICD-10-CM

## 2022-10-19 DIAGNOSIS — Z7985 Long-term (current) use of injectable non-insulin antidiabetic drugs: Secondary | ICD-10-CM

## 2022-10-19 DIAGNOSIS — K5903 Drug induced constipation: Secondary | ICD-10-CM

## 2022-10-19 DIAGNOSIS — Z6841 Body Mass Index (BMI) 40.0 and over, adult: Secondary | ICD-10-CM

## 2022-10-19 DIAGNOSIS — E669 Obesity, unspecified: Secondary | ICD-10-CM | POA: Diagnosis not present

## 2022-10-19 DIAGNOSIS — Z6839 Body mass index (BMI) 39.0-39.9, adult: Secondary | ICD-10-CM

## 2022-10-19 MED ORDER — TIRZEPATIDE 12.5 MG/0.5ML ~~LOC~~ SOAJ: 12.5000 mg | SUBCUTANEOUS | 0 refills | Status: DC

## 2022-10-19 MED ORDER — TIRZEPATIDE 12.5 MG/0.5ML ~~LOC~~ SOAJ
12.5000 mg | SUBCUTANEOUS | 1 refills | Status: DC
Start: 2022-10-19 — End: 2022-11-09

## 2022-10-19 NOTE — Progress Notes (Signed)
Office: [phone]  /  Fax: [phone]  WEIGHT SUMMARY AND BIOMETRICS  Vitals Temp: 98.6 F (37 C) BP: 93/70 (per patient runs low since lost weight) Pulse Rate: 97 SpO2: 96 %   Anthropometric Measurements Height: 5\' 7"  (1.702 m) Weight: 255 lb (115.7 kg) BMI (Calculated): 39.93 Weight at Last Visit: 262 lb Weight Lost Since Last Visit: 7 lb Weight Gained Since Last Visit: 0 Starting Weight: 275 lb Total Weight Loss (lbs): 20 lb (9.072 kg) Peak Weight: 300 lb   Body Composition  Body Fat %: 47.4 % Fat Mass (lbs): 121.2 lbs Muscle Mass (lbs): 127.6 lbs Total Body Water (lbs): 89.8 lbs Visceral Fat Rating : 14    No data recorded Today's Visit #: 9  Starting Date: 03/02/22   HPI  Chief Complaint: OBESITY  [name] [name] is here to discuss her progress with her obesity treatment plan. [name] [name] is on the the Category 3 Plan and states [name] [name] is following her eating plan approximately 80 % of the time. [name] [name] states [name] [name] is exercising 60 minutes 2  times per week.  Interval History:  Since last office visit [name] [name] has lost 7 pounds. [name] [name] reports good adherence to reduced calorie nutritional plan. [name] [name] has been working on reading food labels, not skipping meals, increasing protein intake at every meal, drinking more water, making healthier choices, reducing portion sizes, and incorporating more whole foods  Orexigenic Control: Denies problems with appetite and hunger signals.  Denies problems with satiety and satiation.  Denies problems with eating patterns and portion control.  Denies abnormal cravings. Denies feeling deprived or restricted.   Barriers identified: none.   Pharmacotherapy for weight loss: [name] [name] is currently taking Monjauro with diabetes as the primary indication with adequate clinical response  and without side effects..    ASSESSMENT AND PLAN  TREATMENT PLAN FOR OBESITY:  Recommended Dietary Goals  [name] [name] is currently in the action stage of change. As such,  her goal is to continue weight management plan. [name] [name] has agreed to: continue current plan  Behavioral Intervention  We discussed the following Behavioral Modification Strategies today: continue to work on maintaining a reduced calorie state, getting the recommended amount of protein, incorporating whole foods, making healthy choices, staying well hydrated and practicing mindfulness when eating..  Additional resources provided today: None  Recommended Physical Activity Goals  [name] [name] has been advised to work up to 150 minutes of moderate intensity aerobic activity a week and strengthening exercises 2-3 times per week for cardiovascular health, weight loss maintenance and preservation of muscle mass.   [name] [name] has agreed to :  Think about enjoyable ways to increase daily physical activity and overcoming barriers to exercise and Increase physical activity in their day and reduce sedentary time (increase NEAT).  Pharmacotherapy We discussed various medication options to help [name] [name] with her weight loss efforts and we both agreed to : continue current anti-obesity medication regimen  ASSOCIATED CONDITIONS ADDRESSED TODAY  BMI 40.0-44.9, adult (HCC)  Drug-induced constipation Assessment & Plan: Improved since switching from Ozempic to [facility].   Type 2 diabetes mellitus with other specified complication, without long-term current use of insulin (HCC) Assessment & Plan: HgbA1c is at goal for age and comorbid conditions. [name] [name] is now on Monjauro without any side effects or symptoms of hypoglycemia.  [name] [name] was switched by my partner and started at a high dose.  Patient is tolerating medication well and denies any adverse effects   Lab Results  Component Value Date   HGBA1C 5.9 (H) 06/20/2022  HGBA1C 5.8 (H) 03/02/2022   Lab Results  Component Value Date   LDLCALC 82 03/02/2022   CREATININE 0.74 03/02/2022   [name] [name] will continue on Mounjaro 12.5 mg once weekly.  For diabetic preventative  measures.   Orders: -     Tirzepatide; Inject 12.5 mg into the skin once a week.  Dispense: 2 mL; Refill: 0  Vitamin D deficiency  Generalized obesity with starting BMI of 43 Assessment & Plan: [name] [name] has lost 20 pounds BIA data suggest that [name] [name] is losing muscle mass.  [name] [name] reports consuming the recommended amount of protein and is undergoing physical therapy.  I explained to her that if we are overly restricting calories and not getting enough protein this will have an impact on her muscle mass which will eventually have an effect on her metabolism.  We will reevaluate numbers at the next visit and if we see further decreases I recommend slowing down weight loss rate we may have to reduce dose of Mounjaro.     PHYSICAL EXAM:  Blood pressure 93/70, pulse 97, temperature 98.6 F (37 C), height 5\' 7"  (1.702 m), weight 255 lb (115.7 kg), SpO2 96%. Body mass index is 39.94 kg/m.  General: [name] [name] is overweight, cooperative, alert, well developed, and in no acute distress. PSYCH: Has normal mood, affect and thought process.   HEENT: EOMI, sclerae are anicteric. Lungs: Normal breathing effort, no conversational dyspnea. Extremities: No edema.  Neurologic: No gross sensory or motor deficits. No tremors or fasciculations noted.    DIAGNOSTIC DATA REVIEWED:  BMET    Component Value Date/Time   NA 139 03/02/2022 1034   K 4.6 03/02/2022 1034   CL 104 03/02/2022 1034   CO2 19 (L) 03/02/2022 1034   GLUCOSE 90 03/02/2022 1034   GLUCOSE 101 (H) 07/10/2015 1552   BUN 9 03/02/2022 1034   CREATININE 0.74 03/02/2022 1034   CALCIUM 9.8 03/02/2022 1034   GFRNONAA >60 07/10/2015 1552   GFRAA >60 07/10/2015 1552   Lab Results  Component Value Date   HGBA1C 5.9 (H) 06/20/2022   HGBA1C 5.8 (H) 03/02/2022   Lab Results  Component Value Date   INSULIN 23.8 03/02/2022   Lab Results  Component Value Date   TSH 1.330 03/02/2022   CBC    Component Value Date/Time   WBC 5.5 03/02/2022 1034    WBC 6.8 07/10/2015 1552   RBC 4.91 03/02/2022 1034   RBC 4.73 07/10/2015 1552   HGB 13.3 03/02/2022 1034   HCT 41.9 03/02/2022 1034   PLT 360 03/02/2022 1034   MCV 85 03/02/2022 1034   MCH 27.1 03/02/2022 1034   MCH 26.6 07/10/2015 1552   MCHC 31.7 03/02/2022 1034   MCHC 32.4 07/10/2015 1552   RDW 13.3 03/02/2022 1034   Iron Studies No results found for: "IRON", "TIBC", "FERRITIN", "IRONPCTSAT" Lipid Panel     Component Value Date/Time   CHOL 139 03/02/2022 1034   TRIG 93 03/02/2022 1034   HDL 39 (L) 03/02/2022 1034   LDLCALC 82 03/02/2022 1034   Hepatic Function Panel     Component Value Date/Time   PROT 7.4 03/02/2022 1034   ALBUMIN 4.7 03/02/2022 1034   AST 22 03/02/2022 1034   ALT 21 03/02/2022 1034   ALKPHOS 69 03/02/2022 1034   BILITOT 0.3 03/02/2022 1034      Component Value Date/Time   TSH 1.330 03/02/2022 1034   Nutritional Lab Results  Component Value Date   VD25OH 31.4 06/20/2022   VD25OH 26.9 (  L) 03/02/2022     Return in about 3 weeks (around 11/09/2022) for For Weight Mangement with Dr. Rikki Spearing.Marland Kitchen [name] [name] was informed of the importance of frequent follow up visits to maximize her success with intensive lifestyle modifications for her multiple health conditions.   ATTESTASTION STATEMENTS:  Reviewed by clinician on day of visit: allergies, medications, problem list, medical history, surgical history, family history, social history, and previous encounter notes.     Worthy Rancher, MD

## 2022-10-19 NOTE — Assessment & Plan Note (Signed)
Improved since switching from Ozempic to [place].

## 2022-10-19 NOTE — Assessment & Plan Note (Signed)
[name] [name] has lost 20 pounds BIA data suggest that she is losing muscle mass.  She reports consuming the recommended amount of protein and is undergoing physical therapy.  I explained to her that if we are overly restricting calories and not getting enough protein this will have an impact on her muscle mass which will eventually have an effect on her metabolism.  We will reevaluate numbers at the next visit and if we see further decreases I recommend slowing down weight loss rate we may have to reduce dose of Mounjaro.

## 2022-10-19 NOTE — Assessment & Plan Note (Addendum)
HgbA1c is at goal for age and comorbid conditions. She is now on Monjauro without any side effects or symptoms of hypoglycemia.  She was switched by my partner and started at a high dose.  Patient is tolerating medication well and denies any adverse effects   Lab Results  Component Value Date   HGBA1C 5.9 (H) 06/20/2022   HGBA1C 5.8 (H) 03/02/2022   Lab Results  Component Value Date   LDLCALC 82 03/02/2022   CREATININE 0.74 03/02/2022   She will continue on Mounjaro 12.5 mg once weekly.  For diabetic preventative measures.

## 2022-10-24 NOTE — Telephone Encounter (Signed)
I faxed request for an extension on the authorization.

## 2022-10-25 NOTE — Telephone Encounter (Signed)
NPSG- MCD Healthy blue [facility]: [id number] (exp. 09/01/22 to 11/22/22)    Patient is scheduled at [facility] for 11/22/22 at 9 pm.

## 2022-11-09 ENCOUNTER — Encounter (INDEPENDENT_AMBULATORY_CARE_PROVIDER_SITE_OTHER): Payer: Self-pay | Admitting: Internal Medicine

## 2022-11-09 ENCOUNTER — Ambulatory Visit (INDEPENDENT_AMBULATORY_CARE_PROVIDER_SITE_OTHER): Payer: Medicaid Other | Admitting: Internal Medicine

## 2022-11-09 VITALS — BP 103/71 | HR 86 | Temp 97.5°F | Ht 67.0 in | Wt 256.0 lb

## 2022-11-09 DIAGNOSIS — K5903 Drug induced constipation: Secondary | ICD-10-CM | POA: Diagnosis not present

## 2022-11-09 DIAGNOSIS — Z6841 Body Mass Index (BMI) 40.0 and over, adult: Secondary | ICD-10-CM

## 2022-11-09 DIAGNOSIS — E1169 Type 2 diabetes mellitus with other specified complication: Secondary | ICD-10-CM | POA: Diagnosis not present

## 2022-11-09 DIAGNOSIS — G8929 Other chronic pain: Secondary | ICD-10-CM | POA: Insufficient documentation

## 2022-11-09 DIAGNOSIS — M545 Low back pain, unspecified: Secondary | ICD-10-CM | POA: Diagnosis not present

## 2022-11-09 DIAGNOSIS — Z7985 Long-term (current) use of injectable non-insulin antidiabetic drugs: Secondary | ICD-10-CM

## 2022-11-09 DIAGNOSIS — E669 Obesity, unspecified: Secondary | ICD-10-CM

## 2022-11-09 MED ORDER — TIRZEPATIDE 12.5 MG/0.5ML ~~LOC~~ SOAJ
12.5000 mg | SUBCUTANEOUS | 1 refills | Status: DC
Start: 2022-11-09 — End: 2022-12-08

## 2022-11-09 NOTE — Assessment & Plan Note (Signed)
Patient is inquiring about test results.  This MRI was ordered by someone else it [facility] but she has not received results test was done back in October 11.  I provided her with contact numbers for her to request test results or imaging study to be read.  This is delaying her care.

## 2022-11-09 NOTE — Progress Notes (Signed)
Office: [phone]  /  Fax: [phone]  WEIGHT SUMMARY AND BIOMETRICS  Vitals Temp: (!) 97.5 F (36.4 C) BP: 103/71 Pulse Rate: 86 SpO2: 97 %   Anthropometric Measurements Height: 5\' 7"  (1.702 m) Weight: 256 lb (116.1 kg) BMI (Calculated): 40.09 Weight at Last Visit: 255 lb Weight Lost Since Last Visit: 0 lb Weight Gained Since Last Visit: 1 lb Starting Weight: 275 lb Total Weight Loss (lbs): 19 lb (8.618 kg) Peak Weight: 300 lb   Body Composition  Body Fat %: 47.6 % Fat Mass (lbs): 121.8 lbs Muscle Mass (lbs): 127.4 lbs Total Body Water (lbs): 90.6 lbs Visceral Fat Rating : 14    No data recorded Today's Visit #: 10  Starting Date: 03/02/22   HPI  Chief Complaint: OBESITY  [name] [name] is here to discuss her progress with her obesity treatment plan. She is on the the Category 3 Plan and states she is following her eating plan approximately 80 % of the time. She states she is exercising 15 minutes 5 times per week.  Interval History:   Discussed the use of AI scribe software for clinical note transcription with the patient, who gave verbal consent to proceed.  History of Present Illness   The patient, with a history of hypertension, type 2 diabetes, and obesity, presents for a follow-up on weight management. She is currently on tirzepatide 2.5 mg once a week. However, she reports a two-week gap in medication due to a malfunction with the injection device, which led to a spillage of the medication. The patient noticed an increase in hunger during this period but did not report any significant changes in food cravings. She has been supplementing her diet with protein drinks, which she finds easier to consume than solid foods.  The patient also reports recent constipation, which was managed with a stool softener. She has been experiencing difficulty with meat consumption, preferring fruits, vegetables, and carbohydrates. She has been using protein shakes as a dietary  supplement due to her preference for non-meat sources of protein.  The patient also reports a recent MRI, the results of which are pending. This has led to a pause in her physical therapy and pain management. The patient is also on a daily dose of Vitamin D. The last recorded A1C level from June was 5.9. The patient's cholesterol was last checked in February.     Orexigenic Control: Denies problems with appetite and hunger signals.  Denies problems with satiety and satiation.  Denies problems with eating patterns and portion control.  Denies abnormal cravings. Denies feeling deprived or restricted.   Barriers identified: limited food variation or intolerances.   Pharmacotherapy for weight loss: She is currently taking Monjauro with diabetes as the primary indication with adequate clinical response  and without side effects..    ASSESSMENT AND PLAN  TREATMENT PLAN FOR OBESITY:  Recommended Dietary Goals  [name] [name] is currently in the action stage of change. As such, her goal is to continue weight management plan. She has agreed to: continue current plan  Behavioral Intervention  We discussed the following Behavioral Modification Strategies today: continue to work on maintaining a reduced calorie state, getting the recommended amount of protein, incorporating whole foods, making healthy choices, staying well hydrated and practicing mindfulness when eating. and we reviewed plant-based sources of protein. .  Additional resources provided today: None  Recommended Physical Activity Goals  [name] [name] has been advised to work up to 150 minutes of moderate intensity aerobic activity a week and strengthening  exercises 2-3 times per week for cardiovascular health, weight loss maintenance and preservation of muscle mass.   She has agreed to :  Think about enjoyable ways to increase daily physical activity and overcoming barriers to exercise and Increase physical activity in their day and reduce  sedentary time (increase NEAT).  Pharmacotherapy We discussed various medication options to help [name] [name] with her weight loss efforts and we both agreed to : continue with nutritional and behavioral strategies  ASSOCIATED CONDITIONS ADDRESSED TODAY  Drug-induced constipation Assessment & Plan: Patient counseled on increasing fiber.  She may also use Dulcolax or MiraLAX as needed to maintain regularity.  I do not recommend further increases in GLP-1 therapy.   Type 2 diabetes mellitus with other specified complication, without long-term current use of insulin (HCC) Assessment & Plan: HgbA1c is at goal for age and comorbid conditions. She is now on Monjauro without any side effects or symptoms of hypoglycemia.    Lab Results  Component Value Date   HGBA1C 5.9 (H) 06/20/2022   HGBA1C 5.8 (H) 03/02/2022   Lab Results  Component Value Date   LDLCALC 82 03/02/2022   CREATININE 0.74 03/02/2022   She will continue on Mounjaro 12.5 mg once weekly.  We will check hemoglobin A1c and CMP at the next office visit.  She will follow-up with her primary care team for diabetes preventive measures   Orders: -     Tirzepatide; Inject 12.5 mg into the skin once a week.  Dispense: 2 mL; Refill: 1  Generalized obesity with starting BMI of 43 Assessment & Plan: [name] [name] has lost a total of 19 pounds her weight loss rate has slowed, her treatment with GLP-1 has also been interrupted for 2 weeks.  She is also not getting an adequate amount of protein and physical activity levels have been impacted by her chronic lower back pain.  She is awaiting imaging test results to proceed with treatment.  She inquires about test.  She does not like meat much I would like for her to increase protein from other sources which were reviewed with her today.  This will help prevent adaptive thermogenesis associated with calorie restriction and weight loss.   Chronic bilateral low back pain, unspecified whether sciatica  present Assessment & Plan: Patient is inquiring about test results.  This MRI was ordered by someone else [facility] but she has not received results test was done back in October 11.  I provided her with contact numbers for her to request test results or imaging study to be read.  This is delaying her care.     PHYSICAL EXAM:  Blood pressure 103/71, pulse 86, temperature (!) 97.5 F (36.4 C), height 5\' 7"  (1.702 m), weight 256 lb (116.1 kg), last menstrual period 10/09/2022, SpO2 97%. Body mass index is 40.1 kg/m.  General: She is overweight, cooperative, alert, well developed, and in no acute distress. PSYCH: Has normal mood, affect and thought process.   HEENT: EOMI, sclerae are anicteric. Lungs: Normal breathing effort, no conversational dyspnea. Extremities: No edema.  Neurologic: No gross sensory or motor deficits. No tremors or fasciculations noted.    DIAGNOSTIC DATA REVIEWED:  BMET    Component Value Date/Time   NA 139 03/02/2022 1034   K 4.6 03/02/2022 1034   CL 104 03/02/2022 1034   CO2 19 (L) 03/02/2022 1034   GLUCOSE 90 03/02/2022 1034   GLUCOSE 101 (H) 07/10/2015 1552   BUN 9 03/02/2022 1034   CREATININE 0.74  03/02/2022 1034   CALCIUM 9.8 03/02/2022 1034   GFRNONAA >60 07/10/2015 1552   GFRAA >60 07/10/2015 1552   Lab Results  Component Value Date   HGBA1C 5.9 (H) 06/20/2022   HGBA1C 5.8 (H) 03/02/2022   Lab Results  Component Value Date   INSULIN 23.8 03/02/2022   Lab Results  Component Value Date   TSH 1.330 03/02/2022   CBC    Component Value Date/Time   WBC 5.5 03/02/2022 1034   WBC 6.8 07/10/2015 1552   RBC 4.91 03/02/2022 1034   RBC 4.73 07/10/2015 1552   HGB 13.3 03/02/2022 1034   HCT 41.9 03/02/2022 1034   PLT 360 03/02/2022 1034   MCV 85 03/02/2022 1034   MCH 27.1 03/02/2022 1034   MCH 26.6 07/10/2015 1552   MCHC 31.7 03/02/2022 1034   MCHC 32.4 07/10/2015 1552   RDW 13.3 03/02/2022 1034   Iron Studies No  results found for: "IRON", "TIBC", "FERRITIN", "IRONPCTSAT" Lipid Panel     Component Value Date/Time   CHOL 139 03/02/2022 1034   TRIG 93 03/02/2022 1034   HDL 39 (L) 03/02/2022 1034   LDLCALC 82 03/02/2022 1034   Hepatic Function Panel     Component Value Date/Time   PROT 7.4 03/02/2022 1034   ALBUMIN 4.7 03/02/2022 1034   AST 22 03/02/2022 1034   ALT 21 03/02/2022 1034   ALKPHOS 69 03/02/2022 1034   BILITOT 0.3 03/02/2022 1034      Component Value Date/Time   TSH 1.330 03/02/2022 1034   Nutritional Lab Results  Component Value Date   VD25OH 31.4 06/20/2022   VD25OH 26.9 (L) 03/02/2022     Return in about 4 weeks (around 12/07/2022) for For Weight Mangement with Dr. Rikki Spearing.Marland Kitchen She was informed of the importance of frequent follow up visits to maximize her success with intensive lifestyle modifications for her multiple health conditions.   ATTESTASTION STATEMENTS:  Reviewed by clinician on day of visit: allergies, medications, problem list, medical history, surgical history, family history, social history, and previous encounter notes.     Worthy Rancher, MD

## 2022-11-09 NOTE — Assessment & Plan Note (Signed)
Patient counseled on increasing fiber.  She may also use Dulcolax or MiraLAX as needed to maintain regularity.  I do not recommend further increases in GLP-1 therapy.

## 2022-11-09 NOTE — Assessment & Plan Note (Signed)
HgbA1c is at goal for age and comorbid conditions. She is now on Monjauro without any side effects or symptoms of hypoglycemia.    Lab Results  Component Value Date   HGBA1C 5.9 (H) 06/20/2022   HGBA1C 5.8 (H) 03/02/2022   Lab Results  Component Value Date   LDLCALC 82 03/02/2022   CREATININE 0.74 03/02/2022   She will continue on Mounjaro 12.5 mg once weekly.  We will check hemoglobin A1c and CMP at the next office visit.  She will follow-up with her primary care team for diabetes preventive measures

## 2022-11-09 NOTE — Assessment & Plan Note (Signed)
[name] has lost a total of 19 pounds her weight loss rate has slowed, her treatment with GLP-1 has also been interrupted for 2 weeks.  She is also not getting an adequate amount of protein and physical activity levels have been impacted by her chronic lower back pain.  She is awaiting imaging test results to proceed with treatment.  She inquires about test.  She does not like meat much I would like for her to increase protein from other sources which were reviewed with her today.  This will help prevent adaptive thermogenesis associated with calorie restriction and weight loss.

## 2022-11-16 DIAGNOSIS — M47816 Spondylosis without myelopathy or radiculopathy, lumbar region: Secondary | ICD-10-CM | POA: Insufficient documentation

## 2022-11-22 ENCOUNTER — Ambulatory Visit (INDEPENDENT_AMBULATORY_CARE_PROVIDER_SITE_OTHER): Payer: Medicaid Other | Admitting: Neurology

## 2022-11-22 DIAGNOSIS — G4733 Obstructive sleep apnea (adult) (pediatric): Secondary | ICD-10-CM | POA: Diagnosis not present

## 2022-11-22 DIAGNOSIS — R0602 Shortness of breath: Secondary | ICD-10-CM

## 2022-11-22 DIAGNOSIS — G8929 Other chronic pain: Secondary | ICD-10-CM

## 2022-11-22 DIAGNOSIS — R29818 Other symptoms and signs involving the nervous system: Secondary | ICD-10-CM

## 2022-11-22 DIAGNOSIS — E66813 Obesity, class 3: Secondary | ICD-10-CM

## 2022-11-28 ENCOUNTER — Ambulatory Visit (INDEPENDENT_AMBULATORY_CARE_PROVIDER_SITE_OTHER): Payer: Medicaid Other | Admitting: Internal Medicine

## 2022-12-08 ENCOUNTER — Ambulatory Visit (INDEPENDENT_AMBULATORY_CARE_PROVIDER_SITE_OTHER): Payer: Medicaid Other | Admitting: Internal Medicine

## 2022-12-08 ENCOUNTER — Encounter (INDEPENDENT_AMBULATORY_CARE_PROVIDER_SITE_OTHER): Payer: Self-pay | Admitting: Internal Medicine

## 2022-12-08 VITALS — BP 115/71 | HR 105 | Temp 97.8°F | Ht 67.0 in | Wt 248.0 lb

## 2022-12-08 DIAGNOSIS — E1169 Type 2 diabetes mellitus with other specified complication: Secondary | ICD-10-CM

## 2022-12-08 DIAGNOSIS — Z7985 Long-term (current) use of injectable non-insulin antidiabetic drugs: Secondary | ICD-10-CM

## 2022-12-08 DIAGNOSIS — K5903 Drug induced constipation: Secondary | ICD-10-CM

## 2022-12-08 DIAGNOSIS — E669 Obesity, unspecified: Secondary | ICD-10-CM

## 2022-12-08 DIAGNOSIS — G8929 Other chronic pain: Secondary | ICD-10-CM | POA: Diagnosis not present

## 2022-12-08 DIAGNOSIS — M545 Low back pain, unspecified: Secondary | ICD-10-CM

## 2022-12-08 DIAGNOSIS — Z6838 Body mass index (BMI) 38.0-38.9, adult: Secondary | ICD-10-CM

## 2022-12-08 MED ORDER — TIRZEPATIDE 12.5 MG/0.5ML ~~LOC~~ SOAJ
12.5000 mg | SUBCUTANEOUS | 1 refills | Status: DC
Start: 2022-12-08 — End: 2022-12-20

## 2022-12-08 NOTE — Progress Notes (Signed)
Office: [phone]  /  Fax: [phone]  Weight Summary And Biometrics  Vitals Temp: 97.8 F (36.6 C) BP: 115/71 Pulse Rate: (!) 105 SpO2: 96 %   Anthropometric Measurements Height: 5\' 7"  (1.702 m) Weight: 248 lb (112.5 kg) BMI (Calculated): 38.83 Weight at Last Visit: 256 lb Weight Lost Since Last Visit: 8 lb Weight Gained Since Last Visit: 0 lb Starting Weight: 275 lb Total Weight Loss (lbs): 27 lb (12.2 kg) Peak Weight: 300 lb   Body Composition  Body Fat %: 46.8 % Fat Mass (lbs): 116.2 lbs Muscle Mass (lbs): 125.4 lbs Total Body Water (lbs): 88.8 lbs Visceral Fat Rating : 13    No data recorded Today's Visit #: 11  Starting Date: 03/02/22   Subjective   Chief Complaint: Obesity  [name] [name] is here to discuss her progress with her obesity treatment plan. She is on the the Category 3 Plan and states she is following her eating plan approximately 90 % of the time. She states she is exercising 60 minutes 5 times per week.  Interval History:   Discussed the use of AI scribe software for clinical note transcription with the patient, who gave verbal consent to proceed.  History of Present Illness [name] [name] presents today for follow-up on weight management.  Since last office visit she has lost 8 pounds.  She has been able to increase her physical activity and has made significant dietary changes, including tripling her protein intake. She has incorporated protein-rich foods such as protein cereal, smart bowls with salmon or tuna, and protein shakes into her daily meals. She also reports using flax seed to aid bowel movements.  The patient has been managing constipation with daily stool softeners and is considering trying Metamucil. She also reports occasionally doing a full cleanse when she feels it's necessary. She has been able to increase her physical activity around the house and reports improved sleep since the back procedure.  She is also been able to increase her  physical activity  The patient is on a medication regimen that includes a weekly dose of Mounjaro 12.5 mg. She reports no issues with the medication, but has had some malfunctions with the pen device used for injections. The patient's last A1c was 5.9, indicating well-controlled diabetes. She has also made significant progress in weight loss, losing a total of 27 pounds since March.  The patient has made significant lifestyle changes, including sourcing her meat from a butcher and avoiding processed foods. She has also been experimenting with different recipes and meal plans to increase variety in her diet. She reports feeling full and satisfied after meals and has noticed a decrease in portion sizes. She has been tracking her macros and calories and reports coming under her calorie goal most days.    Orexigenic Control:  Denies problems with appetite and hunger signals.  Denies problems with satiety and satiation.  Denies problems with eating patterns and portion control.  Denies abnormal cravings. Denies feeling deprived or restricted.   Barriers identified: none.   Pharmacotherapy for weight loss: She is currently taking Monjauro with diabetes as the primary indication with adequate clinical response  and without side effects..   Assessment and Plan   Treatment Plan For Obesity:  Recommended Dietary Goals  [name] [name] is currently in the action stage of change. As such, her goal is to continue weight management plan. She has agreed to: continue current plan  Behavioral Intervention  We discussed the following Behavioral Modification Strategies today: continue to work  on maintaining a reduced calorie state, getting the recommended amount of protein, incorporating whole foods, making healthy choices, staying well hydrated and practicing mindfulness when eating..  Additional resources provided today: None  Recommended Physical Activity Goals  [name] [name] has been advised to work up to 150  minutes of moderate intensity aerobic activity a week and strengthening exercises 2-3 times per week for cardiovascular health, weight loss maintenance and preservation of muscle mass.   She has agreed to :  Think about enjoyable ways to increase daily physical activity and overcoming barriers to exercise and Increase physical activity in their day and reduce sedentary time (increase NEAT).  Pharmacotherapy  We discussed various medication options to help [name] [name] with her weight loss efforts and we both agreed to : continue with nutritional and behavioral strategies  Associated Conditions Addressed Today  Type 2 diabetes mellitus with other specified complication, without long-term current use of insulin (HCC) -     Tirzepatide; Inject 12.5 mg into the skin once a week.  Dispense: 2 mL; Refill: 1  Generalized obesity with starting BMI of 43  Drug-induced constipation  Chronic bilateral low back pain, unspecified whether sciatica present    Assessment & Plan Chronic Low Back Pain   Chronic low back pain with arthritis has significantly improved following procedural intervention, with increased physical activity and improved sleep. Mild, manageable pain persists. We will continue the current pain management regimen and monitor pain levels and physical activity.  Type 2 Diabetes Mellitus   Type 2 Diabetes Mellitus is well-controlled, with a recent A1c of 5.9%, on Mounjaro 12.5 mg weekly. She is managing medication-associated constipation. We will continue Mounjaro 12.5 mg weekly.  Refill provided.  We will check disease monitoring labs sometime in January or February.  Constipation   Chronic constipation is managed with stool softeners and dietary modifications, considering Metamucil for better results. We emphasized adequate water intake with Metamucil. She will try Metamucil with adequate water intake, continue stool softeners as needed, and monitor bowel movements and adjust treatment  accordingly.  Obesity Weight loss from 275 lbs to 248 lbs since March is attributed to increased physical activity and a high-protein diet and GLP-1 therapy. We discussed avoiding excessive caloric restriction to prevent muscle mass loss and recommended maintaining caloric intake around 1500 calories/day. She will continue the high-protein dietary regimen, use My Net Diary app for tracking macros and calories, maintain caloric intake around 1500 calories/day, and monitor weight and muscle mass regularly.  General Health Maintenance   Significant lifestyle changes include avoiding processed foods, using a butcher for meat, and incorporating more organic foods. Increased water intake and portion control are noted. We discussed using Skinny Taste for high-protein meal plans and exploring [employer] for convenience. She will continue current healthy eating habits, consider using Skinny Taste for high-protein meal plans, and explore [employer] for convenient high-protein meals.  Follow-up   We will schedule a follow-up appointment in January or February for repeat labs and monitor for any changes in insurance coverage for [facility]. Objective   Physical Exam:  Blood pressure 115/71, pulse (!) 105, temperature 97.8 F (36.6 C), height 5\' 7"  (1.702 m), weight 248 lb (112.5 kg), last menstrual period 12/01/2022, SpO2 96%. Body mass index is 38.84 kg/m.  General: She is overweight, cooperative, alert, well developed, and in no acute distress. PSYCH: Has normal mood, affect and thought process.   HEENT: EOMI, sclerae are anicteric. Lungs: Normal breathing effort, no conversational dyspnea. Extremities: No  edema.  Neurologic: No gross sensory or motor deficits. No tremors or fasciculations noted.    Diagnostic Data Reviewed:  BMET    Component Value Date/Time   NA 139 03/02/2022 1034   K 4.6 03/02/2022 1034   CL 104 03/02/2022 1034   CO2 19 (L) 03/02/2022 1034   GLUCOSE 90  03/02/2022 1034   GLUCOSE 101 (H) 07/10/2015 1552   BUN 9 03/02/2022 1034   CREATININE 0.74 03/02/2022 1034   CALCIUM 9.8 03/02/2022 1034   GFRNONAA >60 07/10/2015 1552   GFRAA >60 07/10/2015 1552   Lab Results  Component Value Date   HGBA1C 5.9 (H) 06/20/2022   HGBA1C 5.8 (H) 03/02/2022   Lab Results  Component Value Date   INSULIN 23.8 03/02/2022   Lab Results  Component Value Date   TSH 1.330 03/02/2022   CBC    Component Value Date/Time   WBC 5.5 03/02/2022 1034   WBC 6.8 07/10/2015 1552   RBC 4.91 03/02/2022 1034   RBC 4.73 07/10/2015 1552   HGB 13.3 03/02/2022 1034   HCT 41.9 03/02/2022 1034   PLT 360 03/02/2022 1034   MCV 85 03/02/2022 1034   MCH 27.1 03/02/2022 1034   MCH 26.6 07/10/2015 1552   MCHC 31.7 03/02/2022 1034   MCHC 32.4 07/10/2015 1552   RDW 13.3 03/02/2022 1034   Iron Studies No results found for: "IRON", "TIBC", "FERRITIN", "IRONPCTSAT" Lipid Panel     Component Value Date/Time   CHOL 139 03/02/2022 1034   TRIG 93 03/02/2022 1034   HDL 39 (L) 03/02/2022 1034   LDLCALC 82 03/02/2022 1034   Hepatic Function Panel     Component Value Date/Time   PROT 7.4 03/02/2022 1034   ALBUMIN 4.7 03/02/2022 1034   AST 22 03/02/2022 1034   ALT 21 03/02/2022 1034   ALKPHOS 69 03/02/2022 1034   BILITOT 0.3 03/02/2022 1034      Component Value Date/Time   TSH 1.330 03/02/2022 1034   Nutritional Lab Results  Component Value Date   VD25OH 31.4 06/20/2022   VD25OH 26.9 (L) 03/02/2022    Follow-Up   Return in about 4 weeks (around 01/05/2023) for For Weight Mangement with Dr. Rikki Spearing.Marland Kitchen She was informed of the importance of frequent follow up visits to maximize her success with intensive lifestyle modifications for her multiple health conditions.  Attestation Statement   Reviewed by clinician on day of visit: allergies, medications, problem list, medical history, surgical history, family history, social history, and previous encounter notes.      Worthy Rancher, MD

## 2022-12-13 ENCOUNTER — Other Ambulatory Visit: Payer: Self-pay | Admitting: Neurology

## 2022-12-13 DIAGNOSIS — G4733 Obstructive sleep apnea (adult) (pediatric): Secondary | ICD-10-CM

## 2022-12-13 DIAGNOSIS — E66813 Obesity, class 3: Secondary | ICD-10-CM

## 2022-12-13 DIAGNOSIS — R29818 Other symptoms and signs involving the nervous system: Secondary | ICD-10-CM

## 2022-12-13 DIAGNOSIS — G8929 Other chronic pain: Secondary | ICD-10-CM
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
## 2022-12-14 ENCOUNTER — Telehealth: Payer: Self-pay

## 2022-12-14 ENCOUNTER — Telehealth: Payer: Self-pay | Admitting: Neurology

## 2022-12-14 NOTE — Telephone Encounter (Signed)
Spoke to pt gave sleep study results Pt chose [employer] for DME Pt aware of insurance compliance Gave [employer] # to patient sent referring MD sleep study results and sent a community message to [employer] about current orders for patient  Pt expressed understanding and thanked me for calling  Made f/u appointment with Amy,NP 03/2023

## 2022-12-14 NOTE — Telephone Encounter (Signed)
-----   Message from Gretna Dohmeier sent at 12/13/2022 10:36 AM EST ----- Overall not severe apnea ,more hypopnea and REM sleep and supine dependent form. This will respond well to CPAP, and weight loss is the best long term therapy for hypoventilation apnea in obesity.

## 2022-12-14 NOTE — Telephone Encounter (Signed)
Called patient and couldn't leave message do to voicemail not setup.

## 2022-12-14 NOTE — Telephone Encounter (Signed)
Pt returning call for results re: sleep

## 2022-12-19 ENCOUNTER — Telehealth (INDEPENDENT_AMBULATORY_CARE_PROVIDER_SITE_OTHER): Payer: Self-pay | Admitting: Internal Medicine

## 2022-12-19 NOTE — Telephone Encounter (Signed)
12/16 Pt called stating she will run out of Monjauro before her next visit and wants to know if a prescription can be sent in for her. Please follow up.

## 2022-12-20 ENCOUNTER — Other Ambulatory Visit (INDEPENDENT_AMBULATORY_CARE_PROVIDER_SITE_OTHER): Payer: Self-pay | Admitting: Internal Medicine

## 2022-12-20 ENCOUNTER — Other Ambulatory Visit (INDEPENDENT_AMBULATORY_CARE_PROVIDER_SITE_OTHER): Payer: Self-pay

## 2022-12-20 DIAGNOSIS — E1169 Type 2 diabetes mellitus with other specified complication: Secondary | ICD-10-CM

## 2022-12-20 MED ORDER — TIRZEPATIDE 12.5 MG/0.5ML ~~LOC~~ SOAJ: 12.5000 mg | SUBCUTANEOUS | 1 refills | Status: DC

## 2022-12-20 MED ORDER — TIRZEPATIDE 12.5 MG/0.5ML ~~LOC~~ SOAJ
12.5000 mg | SUBCUTANEOUS | 1 refills | Status: DC
Start: 2022-12-20 — End: 2022-12-20

## 2022-12-21 ENCOUNTER — Telehealth: Payer: Self-pay

## 2023-01-02 DIAGNOSIS — J069 Acute upper respiratory infection, unspecified: Secondary | ICD-10-CM | POA: Insufficient documentation

## 2023-01-02 DIAGNOSIS — J019 Acute sinusitis, unspecified: Secondary | ICD-10-CM | POA: Insufficient documentation

## 2023-01-02 DIAGNOSIS — Z20828 Contact with and (suspected) exposure to other viral communicable diseases: Secondary | ICD-10-CM | POA: Insufficient documentation

## 2023-01-17 ENCOUNTER — Ambulatory Visit (INDEPENDENT_AMBULATORY_CARE_PROVIDER_SITE_OTHER): Payer: Medicaid Other | Admitting: Internal Medicine

## 2023-01-17 ENCOUNTER — Encounter (INDEPENDENT_AMBULATORY_CARE_PROVIDER_SITE_OTHER): Payer: Self-pay | Admitting: Internal Medicine

## 2023-01-17 VITALS — BP 109/70 | HR 94 | Temp 98.3°F | Ht 67.0 in | Wt 247.0 lb

## 2023-01-17 DIAGNOSIS — E66813 Obesity, class 3: Secondary | ICD-10-CM

## 2023-01-17 DIAGNOSIS — E1169 Type 2 diabetes mellitus with other specified complication: Secondary | ICD-10-CM | POA: Diagnosis not present

## 2023-01-17 DIAGNOSIS — G4733 Obstructive sleep apnea (adult) (pediatric): Secondary | ICD-10-CM

## 2023-01-17 DIAGNOSIS — Z7985 Long-term (current) use of injectable non-insulin antidiabetic drugs: Secondary | ICD-10-CM

## 2023-01-17 DIAGNOSIS — K5903 Drug induced constipation: Secondary | ICD-10-CM | POA: Diagnosis not present

## 2023-01-17 DIAGNOSIS — Z6838 Body mass index (BMI) 38.0-38.9, adult: Secondary | ICD-10-CM

## 2023-01-17 MED ORDER — TIRZEPATIDE 12.5 MG/0.5ML ~~LOC~~ SOAJ
12.5000 mg | SUBCUTANEOUS | 1 refills | Status: DC
Start: 2023-01-17 — End: 2023-02-14

## 2023-01-17 NOTE — Assessment & Plan Note (Addendum)
 Reviewed sleep study from November 2024, she has a mild OSA for which CPAP was recommended. She reports nighttime awakenings have improved. Feels much better and more energetic.  When taking in consideration her peak weight versus her starting weight she has lost 18% of total body weight.  This is likely the reason why her symptoms are better and also she had mild OSA.

## 2023-01-17 NOTE — Assessment & Plan Note (Signed)
 Her current BMI is 38 when considering her peak weight she has lost 18% of total body weight over 47 pounds.  Her BIA suggest preservation of muscle mass and reduction in SAT and VAT.  Because of chronic back pain she has difficulty doing strengthening exercises but has been intentional about increasing her aerobic activity.  We have reviewed on multiple occasions the importance of strengthening to prevent muscle loss and preservation of muscle mass.  She will continue on GLP-1 treatment for orexigenic control.

## 2023-01-17 NOTE — Assessment & Plan Note (Signed)
 Improved on as needed Dulcolax and MiraLAX.  Continue to increase consumption of fibrous foods.

## 2023-01-17 NOTE — Progress Notes (Signed)
 Office: [phone]  /  Fax: [phone]  Weight Summary And Biometrics  Vitals Temp: 98.3 F (36.8 C) BP: 109/70 Pulse Rate: 94 SpO2: 99 %   Anthropometric Measurements Height: 5' 7 (1.702 m) Weight: 247 lb (112 kg) BMI (Calculated): 38.68 Weight at Last Visit: 248 lb Weight Lost Since Last Visit: 1 lb Weight Gained Since Last Visit: 0 lb Starting Weight: 275 lb Total Weight Loss (lbs): 28 lb (12.7 kg) Peak Weight: 300 lb   Body Composition  Body Fat %: 46.8 % Fat Mass (lbs): 115.6 lbs Muscle Mass (lbs): 124.8 lbs Total Body Water (lbs): 89.4 lbs Visceral Fat Rating : 14    No data recorded Today's Visit #: 12  Starting Date: 03/02/22   Subjective   Chief Complaint: Obesity  [name] [name] is here to discuss her progress with her obesity treatment plan. She is on the the Category 3 Plan and states she is following her eating plan approximately 85 % of the time. She states she is walking 5,000 steps 7 times per week.  Interval History:   Since last office visit she has lost 1 pounds. She reports good adherence to reduced calorie nutritional plan. She has been working on reading food labels, not skipping meals, increasing protein intake at every meal, drinking more water, making healthier choices, reducing portion sizes, and incorporating more whole foods   Sleeping approximately 8 hours a day.  Sleep described as restorative. Now on Cpap   Orexigenic Control:  Reports problems with appetite and hunger signals at night. Denies problems with satiety and satiation.  Denies problems with eating patterns and portion control.  Denies abnormal cravings. Denies feeling deprived or restricted.   Barriers identified: low volume of physical activity at present , orthopedic problems, medical conditions or chronic pain affecting mobility, medical comorbidities, and sleep apnea.   Pharmacotherapy for weight loss: She is currently taking Monjauro with diabetes as the  primary indication with adequate clinical response  and without side effects..   Assessment and Plan   Treatment Plan For Obesity:  Recommended Dietary Goals  [name] is currently in the action stage of change. As such, her goal is to continue weight management plan. She has agreed to: continue current plan  Behavioral Intervention  We discussed the following Behavioral Modification Strategies today: continue to work on maintaining a reduced calorie state, getting the recommended amount of protein, incorporating whole foods, making healthy choices, staying well hydrated and practicing mindfulness when eating..  Additional resources provided today: None  Recommended Physical Activity Goals  [name] has been advised to work up to 150 minutes of moderate intensity aerobic activity a week and strengthening exercises 2-3 times per week for cardiovascular health, weight loss maintenance and preservation of muscle mass.   She has agreed to :  continue to gradually increase the amount and intensity of exercise routine  Pharmacotherapy  We discussed various medication options to help [name] with her weight loss efforts and we both agreed to : continue current anti-obesity medication regimen and do not increase due to skipping meals.  Associated Conditions Addressed and Impacted by Obesity Treatment  Type 2 diabetes mellitus with other specified complication, without long-term current use of insulin  (HCC) Assessment & Plan: HBGM at goal, no lows. Last A1c 5.9, scheduled to have labs in Feb at [facility] office.  Continue Mounjaro  at 12.5 mg once a week.  No medication side effects at present time.  Continue at current dose.  Follow-up on labs  Orders: -  Tirzepatide ; Inject 12.5 mg into the skin once a week.  Dispense: 2 mL; Refill: 1  Drug-induced constipation Assessment & Plan: Improved on as needed Dulcolax and MiraLAX .  Continue to increase consumption of fibrous foods.   Class 3 severe  obesity with serious comorbidity and body mass index (BMI) of 40.0 to 44.9 in adult, unspecified obesity type [facility]) Assessment & Plan: Her current BMI is 38 when considering her peak weight she has lost 18% of total body weight over 47 pounds.  Her BIA suggest preservation of muscle mass and reduction in SAT and VAT.  Because of chronic back pain she has difficulty doing strengthening exercises but has been intentional about increasing her aerobic activity.  We have reviewed on multiple occasions the importance of strengthening to prevent muscle loss and preservation of muscle mass.  She will continue on GLP-1 treatment for orexigenic control.   OSA on CPAP Assessment & Plan: Reviewed sleep study from November 2024, she has a mild OSA for which CPAP was recommended. She reports nighttime awakenings have improved. Feels much better and more energetic.  When taking in consideration her peak weight versus her starting weight she has lost 18% of total body weight.  This is likely the reason why her symptoms are better and also she had mild OSA.      Objective   Physical Exam:  Blood pressure 109/70, pulse 94, temperature 98.3 F (36.8 C), height 5' 7 (1.702 m), weight 247 lb (112 kg), SpO2 99%. Body mass index is 38.69 kg/m.  General: She is overweight, cooperative, alert, well developed, and in no acute distress. PSYCH: Has normal mood, affect and thought process.   HEENT: EOMI, sclerae are anicteric. Lungs: Normal breathing effort, no conversational dyspnea. Extremities: No edema.  Neurologic: No gross sensory or motor deficits. No tremors or fasciculations noted.    Diagnostic Data Reviewed:  BMET    Component Value Date/Time   NA 139 03/02/2022 1034   K 4.6 03/02/2022 1034   CL 104 03/02/2022 1034   CO2 19 (L) 03/02/2022 1034   GLUCOSE 90 03/02/2022 1034   GLUCOSE 101 (H) 07/10/2015 1552   BUN 9 03/02/2022 1034   CREATININE 0.74 03/02/2022 1034   CALCIUM 9.8 03/02/2022 1034    GFRNONAA >60 07/10/2015 1552   GFRAA >60 07/10/2015 1552   Lab Results  Component Value Date   HGBA1C 5.9 (H) 06/20/2022   HGBA1C 5.8 (H) 03/02/2022   Lab Results  Component Value Date   INSULIN  23.8 03/02/2022   Lab Results  Component Value Date   TSH 1.330 03/02/2022   CBC    Component Value Date/Time   WBC 5.5 03/02/2022 1034   WBC 6.8 07/10/2015 1552   RBC 4.91 03/02/2022 1034   RBC 4.73 07/10/2015 1552   HGB 13.3 03/02/2022 1034   HCT 41.9 03/02/2022 1034   PLT 360 03/02/2022 1034   MCV 85 03/02/2022 1034   MCH 27.1 03/02/2022 1034   MCH 26.6 07/10/2015 1552   MCHC 31.7 03/02/2022 1034   MCHC 32.4 07/10/2015 1552   RDW 13.3 03/02/2022 1034   Iron Studies No results found for: IRON, TIBC, FERRITIN, IRONPCTSAT Lipid Panel     Component Value Date/Time   CHOL 139 03/02/2022 1034   TRIG 93 03/02/2022 1034   HDL 39 (L) 03/02/2022 1034   LDLCALC 82 03/02/2022 1034   Hepatic Function Panel     Component Value Date/Time   PROT 7.4 03/02/2022 1034   ALBUMIN 4.7 03/02/2022  1034   AST 22 03/02/2022 1034   ALT 21 03/02/2022 1034   ALKPHOS 69 03/02/2022 1034   BILITOT 0.3 03/02/2022 1034      Component Value Date/Time   TSH 1.330 03/02/2022 1034   Nutritional Lab Results  Component Value Date   VD25OH 31.4 06/20/2022   VD25OH 26.9 (L) 03/02/2022    Follow-Up   Return in about 4 weeks (around 02/14/2023) for For Weight Mangement with Dr. Francyne.SABRA She was informed of the importance of frequent follow up visits to maximize her success with intensive lifestyle modifications for her multiple health conditions.  Attestation Statement   Reviewed by clinician on day of visit: allergies, medications, problem list, medical history, surgical history, family history, social history, and previous encounter notes.     Lucas Francyne, MD

## 2023-01-17 NOTE — Assessment & Plan Note (Addendum)
 HBGM at goal, no lows. Last A1c 5.9, scheduled to have labs in Feb at [facility] office.  Continue Mounjaro at 12.5 mg once a week.  No medication side effects at present time.  Continue at current dose.  Follow-up on labs

## 2023-02-14 ENCOUNTER — Encounter (INDEPENDENT_AMBULATORY_CARE_PROVIDER_SITE_OTHER): Payer: Self-pay | Admitting: Internal Medicine

## 2023-02-14 ENCOUNTER — Ambulatory Visit (INDEPENDENT_AMBULATORY_CARE_PROVIDER_SITE_OTHER): Payer: Medicaid Other | Admitting: Internal Medicine

## 2023-02-14 VITALS — BP 114/70 | HR 89 | Temp 98.7°F | Ht 67.0 in | Wt 243.0 lb

## 2023-02-14 DIAGNOSIS — G4733 Obstructive sleep apnea (adult) (pediatric): Secondary | ICD-10-CM | POA: Diagnosis not present

## 2023-02-14 DIAGNOSIS — E66813 Obesity, class 3: Secondary | ICD-10-CM | POA: Diagnosis not present

## 2023-02-14 DIAGNOSIS — Z6841 Body Mass Index (BMI) 40.0 and over, adult: Secondary | ICD-10-CM

## 2023-02-14 DIAGNOSIS — E1169 Type 2 diabetes mellitus with other specified complication: Secondary | ICD-10-CM | POA: Diagnosis not present

## 2023-02-14 DIAGNOSIS — Z7985 Long-term (current) use of injectable non-insulin antidiabetic drugs: Secondary | ICD-10-CM

## 2023-02-14 MED ORDER — TIRZEPATIDE 12.5 MG/0.5ML ~~LOC~~ SOAJ
12.5000 mg | SUBCUTANEOUS | 1 refills | Status: DC
Start: 2023-02-14 — End: 2023-03-21

## 2023-02-14 NOTE — Progress Notes (Signed)
Office: [phone]  /  Fax: [phone]  Weight Summary And Biometrics  Vitals Temp: 98.7 F (37.1 C) BP: 114/70 Pulse Rate: 89 SpO2: 99 %   Anthropometric Measurements Height: 5\' 7"  (1.702 m) Weight: 243 lb (110.2 kg) BMI (Calculated): 38.05 Weight at Last Visit: 247 lb Weight Lost Since Last Visit: 4 lb Weight Gained Since Last Visit: 0 lb Starting Weight: 275 lb Total Weight Loss (lbs): 32 lb (14.5 kg) Peak Weight: 300   Body Composition  Body Fat %: 46.3 % Fat Mass (lbs): 112.6 lbs Muscle Mass (lbs): 124 lbs Total Body Water (lbs): 89.2 lbs Visceral Fat Rating : 13    No data recorded Today's Visit #: 13  Starting Date: 03/02/22   Subjective   Chief Complaint: Obesity  [name] [name] is here to discuss her progress with her obesity treatment plan. She is on the the Category 3 Plan and states she is following her eating plan approximately 80 % of the time. She states she is exercising 20-30 minutes 7 times per week.  Weight Progress Since Last Visit:  Discussed the use of AI scribe software for clinical note transcription with the patient, who gave verbal consent to proceed.  History of Present Illness   [name] [name] is a 53 year old female who presents for medical weight management.  She has lost 57 pounds over the past year, currently weighing 243 pounds, down from a peak of 300 pounds. This weight loss is attributed to the use of Mounjaro, which she finds effective without side effects, and increased physical activity. She exercises 20 to 30 minutes daily and has maintained muscle mass while losing fat. Her menstrual cycle has returned after a six-month absence, which she attributes to hormonal regulation from the medication.  She experiences a persistent loss of taste and reduced enjoyment of food following a COVID-19 infection and sinus infection that began over a month ago. Her sense of smell was already diminished prior to these infections. Despite the loss  of taste, she maintains her nutrition with protein bars and shakes, experiences a strong sense of fullness, and eats significantly less than usual.  She uses a CPAP machine, which has improved her sleep quality and daytime energy levels. She reports no issues with the CPAP mask and uses eye drops for dry eyes in the morning. She also takes a multivitamin, vitamin D, and collagen supplements daily.  She has a history of PCOS, which she believes contributed to her weight gain. She is currently taking a statin for cardiovascular risk reduction. She reports no history of hypertension medication use, and her blood pressure has normalized with weight loss.  She underwent nerve burning procedures on both sides in late December and January, which have significantly reduced her pain and allowed her to complete housework.       Challenges affecting patient progress: orthopedic problems, medical conditions or chronic pain affecting mobility.   Orexigenic Control: Denies problems with appetite and hunger signals.  Denies problems with satiety and satiation.  Denies problems with eating patterns and portion control.  Denies abnormal cravings. Denies feeling deprived or restricted.   Pharmacotherapy for weight management: She is currently taking Monjauro with diabetes as the primary indication with adequate clinical response  and without side effects..   Assessment and Plan   Treatment Plan For Obesity:  Recommended Dietary Goals  [name] [name] is currently in the action stage of change. As such, her goal is to continue weight management plan. She has agreed to: continue  current plan  Behavioral Health and Counseling  We discussed the following behavioral modification strategies today: continue to work on maintaining a reduced calorie state, getting the recommended amount of protein, incorporating whole foods, making healthy choices, staying well hydrated and practicing mindfulness when  eating..  Additional education and resources provided today: None  Recommended Physical Activity Goals  [name] [name] has been advised to work up to 150 minutes of moderate intensity aerobic activity a week and strengthening exercises 2-3 times per week for cardiovascular health, weight loss maintenance and preservation of muscle mass.   She has agreed to :  Think about enjoyable ways to increase daily physical activity and overcoming barriers to exercise and Increase physical activity in their day and reduce sedentary time (increase NEAT).  Pharmacotherapy  We discussed various medication options to help [name] [name] with her weight loss efforts and we both agreed to : adequate clinical response to current dose, continue current regimen  Associated Conditions Impacted by Obesity Treatment  OSA on CPAP Assessment & Plan: Reviewed sleep study from November 2024, she has a mild OSA for which CPAP was recommended. She reports nighttime awakenings have improved. Feels much better and more energetic.  When taking in consideration her peak weight versus her starting weight she has lost 18% of total body weight.  Continue with medically supervised weight loss program inclusive of GLP-1 therapy.   Type 2 diabetes mellitus with other specified complication, without long-term current use of insulin (HCC) Assessment & Plan: HBGM at goal, no lows. Last A1c 5.9, scheduled to have labs in Feb at [facility] office.  Continue Mounjaro at 12.5 mg once a week.  No medication side effects at present time.  Continue at current dose.  Follow-up on labs  Orders: -     Tirzepatide; Inject 12.5 mg into the skin once a week.  Dispense: 2 mL; Refill: 1  Class 3 severe obesity with serious comorbidity and body mass index (BMI) of 40.0 to 44.9 in adult, unspecified obesity type [facility]) Assessment & Plan: See obesity treatment plan     Assessment and Plan    Obesity Lost 57 pounds over the past year, current weight 243 pounds  from a peak of 300 pounds. On Mounjaro 12.5 mg, providing adequate appetite suppression without significant side effects. Engaging in regular exercise (20-30 minutes daily) and using CPAP for sleep apnea, improving energy levels. Taking multivitamins, vitamin D, and collagen supplements. Reports no constipation, managed with stool softeners and fiber supplements. Discussed the importance of progressive exercise and maintaining a balanced diet. Current regimen is effective and sustainable. - Continue Mounjaro 12.5 mg - Encourage progressive exercise - Ensure adequate protein intake - Maintain hydration and balanced diet - Follow up in one month  Polycystic Ovary Syndrome (PCOS) PCOS contributing to weight gain. [name] [name] may be helping to regulate menstrual cycles, resumed menstruation after a six-month hiatus. Discussed the potential benefits of continued weight loss on PCOS symptoms and overall health. - Monitor menstrual cycles - Continue current management with [facility]   General Health Maintenance Scheduled for blood work this week, including A1c and insulin levels. Taking a statin for cardiovascular risk reduction. Discussed the importance of fasting for accurate lab results and monitoring cholesterol and cardiovascular risk. - Ensure fasting for blood work - Add insulin levels to lab orders - Monitor cholesterol and cardiovascular risk  Follow-up - Follow up in one month.       Objective   Physical Exam:  Blood pressure 114/70, pulse 89, temperature 98.7  F (37.1 C), height 5\' 7"  (1.702 m), weight 243 lb (110.2 kg), last menstrual period 02/10/2023, SpO2 99%. Body mass index is 38.06 kg/m.  General: She is overweight, cooperative, alert, well developed, and in no acute distress. PSYCH: Has normal mood, affect and thought process.   HEENT: EOMI, sclerae are anicteric. Lungs: Normal breathing effort, no conversational dyspnea. Extremities: No edema.  Neurologic: No gross  sensory or motor deficits. No tremors or fasciculations noted.    Diagnostic Data Reviewed:  BMET    Component Value Date/Time   NA 139 03/02/2022 1034   K 4.6 03/02/2022 1034   CL 104 03/02/2022 1034   CO2 19 (L) 03/02/2022 1034   GLUCOSE 90 03/02/2022 1034   GLUCOSE 101 (H) 07/10/2015 1552   BUN 9 03/02/2022 1034   CREATININE 0.74 03/02/2022 1034   CALCIUM 9.8 03/02/2022 1034   GFRNONAA >60 07/10/2015 1552   GFRAA >60 07/10/2015 1552   Lab Results  Component Value Date   HGBA1C 5.9 (H) 06/20/2022   HGBA1C 5.8 (H) 03/02/2022   Lab Results  Component Value Date   INSULIN 23.8 03/02/2022   Lab Results  Component Value Date   TSH 1.330 03/02/2022   CBC    Component Value Date/Time   WBC 5.5 03/02/2022 1034   WBC 6.8 07/10/2015 1552   RBC 4.91 03/02/2022 1034   RBC 4.73 07/10/2015 1552   HGB 13.3 03/02/2022 1034   HCT 41.9 03/02/2022 1034   PLT 360 03/02/2022 1034   MCV 85 03/02/2022 1034   MCH 27.1 03/02/2022 1034   MCH 26.6 07/10/2015 1552   MCHC 31.7 03/02/2022 1034   MCHC 32.4 07/10/2015 1552   RDW 13.3 03/02/2022 1034   Iron Studies No results found for: "IRON", "TIBC", "FERRITIN", "IRONPCTSAT" Lipid Panel     Component Value Date/Time   CHOL 139 03/02/2022 1034   TRIG 93 03/02/2022 1034   HDL 39 (L) 03/02/2022 1034   LDLCALC 82 03/02/2022 1034   Hepatic Function Panel     Component Value Date/Time   PROT 7.4 03/02/2022 1034   ALBUMIN 4.7 03/02/2022 1034   AST 22 03/02/2022 1034   ALT 21 03/02/2022 1034   ALKPHOS 69 03/02/2022 1034   BILITOT 0.3 03/02/2022 1034      Component Value Date/Time   TSH 1.330 03/02/2022 1034   Nutritional Lab Results  Component Value Date   VD25OH 31.4 06/20/2022   VD25OH 26.9 (L) 03/02/2022    Follow-Up   Return in about 4 weeks (around 03/14/2023) for For Weight Mangement with Dr. Rikki Spearing.Marland Kitchen She was informed of the importance of frequent follow up visits to maximize her success with intensive lifestyle  modifications for her multiple health conditions.  Attestation Statement   Reviewed by clinician on day of visit: allergies, medications, problem list, medical history, surgical history, family history, social history, and previous encounter notes.     Worthy Rancher, MD

## 2023-02-14 NOTE — Assessment & Plan Note (Signed)
See obesity treatment plan

## 2023-02-14 NOTE — Assessment & Plan Note (Signed)
Blood pressure is  at goal.  She is currently not on blood pressure medications.

## 2023-02-14 NOTE — Assessment & Plan Note (Signed)
HBGM at goal, no lows. Last A1c 5.9, scheduled to have labs in Feb at [facility] office.  Continue Mounjaro at 12.5 mg once a week.  No medication side effects at present time.  Continue at current dose.  Follow-up on labs

## 2023-02-14 NOTE — Assessment & Plan Note (Signed)
Reviewed sleep study from November 2024, she has a mild OSA for which CPAP was recommended. She reports nighttime awakenings have improved. Feels much better and more energetic.  When taking in consideration her peak weight versus her starting weight she has lost 18% of total body weight.  Continue with medically supervised weight loss program inclusive of GLP-1 therapy.

## 2023-02-15 LAB — TSH: TSH: 1.27 (ref 0.41–5.90)

## 2023-02-15 LAB — COMPREHENSIVE METABOLIC PANEL WITH GFR
A1c: 5
Calcium: 10.1 (ref 8.7–10.7)
eGFR: 91.2

## 2023-02-15 LAB — BASIC METABOLIC PANEL
CO2: 25 — AB (ref 13–22)
Chloride: 105 (ref 99–108)
Creatinine: 0.8 (ref 0.5–1.1)
Glucose: 92
Potassium: 4.5 meq/L (ref 3.5–5.1)
Sodium: 139 (ref 137–147)

## 2023-02-15 LAB — CBC AND DIFFERENTIAL
Neutrophils Absolute: 2.98
WBC: 5.9

## 2023-02-15 LAB — HEPATIC FUNCTION PANEL
ALT: 22 U/L (ref 7–35)
AST: 23 (ref 13–35)
Alkaline Phosphatase: 71 (ref 25–125)
Bilirubin, Total: 0.3

## 2023-02-15 LAB — LIPID PANEL
Cholesterol: 152 (ref 0–200)
HDL: 40 (ref 35–70)
LDL Cholesterol: 96
Triglycerides: 80 (ref 40–160)

## 2023-02-15 LAB — CBC: RBC: 4.83 (ref 3.87–5.11)

## 2023-02-15 LAB — VITAMIN D 25 HYDROXY (VIT D DEFICIENCY, FRACTURES): Vit D, 25-Hydroxy: 44.1

## 2023-02-15 LAB — VITAMIN B12: Vitamin B-12: 702
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
## 2023-03-13 ENCOUNTER — Other Ambulatory Visit (INDEPENDENT_AMBULATORY_CARE_PROVIDER_SITE_OTHER): Payer: Self-pay | Admitting: Internal Medicine

## 2023-03-13 DIAGNOSIS — E1169 Type 2 diabetes mellitus with other specified complication: Secondary | ICD-10-CM

## 2023-03-13 NOTE — Progress Notes (Unsigned)
 Marland Kitchen

## 2023-03-14 ENCOUNTER — Telehealth: Payer: Self-pay

## 2023-03-14 ENCOUNTER — Ambulatory Visit: Payer: Medicaid Other | Admitting: Family Medicine

## 2023-03-14 ENCOUNTER — Encounter: Payer: Self-pay | Admitting: Family Medicine

## 2023-03-14 VITALS — BP 99/76 | HR 92 | Ht 67.0 in | Wt 246.0 lb

## 2023-03-14 DIAGNOSIS — G4701 Insomnia due to medical condition: Secondary | ICD-10-CM | POA: Diagnosis not present

## 2023-03-14 DIAGNOSIS — G8921 Chronic pain due to trauma: Secondary | ICD-10-CM | POA: Diagnosis not present

## 2023-03-14 DIAGNOSIS — G4733 Obstructive sleep apnea (adult) (pediatric): Secondary | ICD-10-CM | POA: Diagnosis not present

## 2023-03-14 DIAGNOSIS — G8929 Other chronic pain: Secondary | ICD-10-CM

## 2023-03-14 NOTE — Patient Instructions (Addendum)
 Please continue using your CPAP regularly. While your insurance requires that you use CPAP at least 4 hours each night on 70% of the nights, I recommend, that you not skip any nights and use it throughout the night if you can. Getting used to CPAP and staying with the treatment long term does take time and patience and discipline. Untreated obstructive sleep apnea when it is moderate to severe can have an adverse impact on cardiovascular health and raise her risk for heart disease, arrhythmias, hypertension, congestive heart failure, stroke and diabetes. Untreated obstructive sleep apnea causes sleep disruption, nonrestorative sleep, and sleep deprivation. This can have an impact on your day to day functioning and cause daytime sleepiness and impairment of cognitive function, memory loss, mood disturbance, and problems focussing. Using CPAP regularly can improve these symptoms.  We will update supply orders, today. DME: [facility] [phone]  Follow up in 6 month

## 2023-03-14 NOTE — Telephone Encounter (Signed)
  Community msg sent to [facility] to order cpap supplies

## 2023-03-21 ENCOUNTER — Ambulatory Visit (INDEPENDENT_AMBULATORY_CARE_PROVIDER_SITE_OTHER): Payer: Medicaid Other | Admitting: Internal Medicine

## 2023-03-21 ENCOUNTER — Encounter (INDEPENDENT_AMBULATORY_CARE_PROVIDER_SITE_OTHER): Payer: Self-pay | Admitting: Internal Medicine

## 2023-03-21 VITALS — BP 104/72 | HR 87 | Temp 97.7°F | Ht 67.0 in | Wt 237.0 lb

## 2023-03-21 DIAGNOSIS — E7800 Pure hypercholesterolemia, unspecified: Secondary | ICD-10-CM

## 2023-03-21 DIAGNOSIS — E66813 Obesity, class 3: Secondary | ICD-10-CM | POA: Diagnosis not present

## 2023-03-21 DIAGNOSIS — G4733 Obstructive sleep apnea (adult) (pediatric): Secondary | ICD-10-CM | POA: Diagnosis not present

## 2023-03-21 DIAGNOSIS — Z7985 Long-term (current) use of injectable non-insulin antidiabetic drugs: Secondary | ICD-10-CM

## 2023-03-21 DIAGNOSIS — E1169 Type 2 diabetes mellitus with other specified complication: Secondary | ICD-10-CM

## 2023-03-21 DIAGNOSIS — Z6841 Body Mass Index (BMI) 40.0 and over, adult: Secondary | ICD-10-CM

## 2023-03-21 MED ORDER — TIRZEPATIDE 12.5 MG/0.5ML ~~LOC~~ SOAJ: 12.5000 mg | SUBCUTANEOUS | 1 refills | Status: DC

## 2023-03-21 NOTE — Assessment & Plan Note (Signed)
 She uses CPAP therapy and reports improved sleep quality. Weight loss and lifestyle changes may further improve her sleep apnea management. - Continue CPAP therapy - Monitor sleep quality and symptoms

## 2023-03-21 NOTE — Assessment & Plan Note (Signed)
 Her most recent A1c was 5.0 based on external labs from February of this year.  Her insulin levels have improved.  She had a few episodes of hypoglycemia which have been self managed.  We reviewed recognition and management of hypoglycemia today.  It is rare to have hypoglycemia with GLP-1 therapy alone but may occur in 10% of people.  We encouraged her to self monitor and report any more hypoglycemic spells.

## 2023-03-21 NOTE — Assessment & Plan Note (Signed)
 Her LDL cholesterol has slightly increased from 82 to 96, but triglycerides remain at 80. She is taking rosuvastatin for cholesterol management as part of her cardiovascular risk reduction strategy. - Continue rosuvastatin - Monitor lipid profile regularly

## 2023-03-21 NOTE — Assessment & Plan Note (Signed)
 She has lost 60 pounds, approximately 21% of her body weight, reducing her BMI from 43 to 37. She follows a reduced calorie nutrition plan and engages in regular physical activity, including walking six days a week for 60 minutes. She incorporates more whole foods and eliminates empty calories. Mounjaro 12.5 mg has contributed to her weight loss, and her A1c has improved from 5.9 to 5.0, indicating better glycemic control. She aims to reach 200 pounds to qualify for hernia surgery. Mounjaro typically results in 15-20% weight loss in patients with diabetes, and she has achieved a 21% reduction, indicating a positive response.  - Continue Mounjaro 12.5 mg - Adhere to reduced calorie nutrition plan - Continue regular physical activity - Monitor weight and BMI - Discuss potential hernia surgery when weight goal is achieved - Educate on hypoglycemia management and ensure access to rapid-acting sugar

## 2023-03-21 NOTE — Progress Notes (Signed)
 Office: [phone]  /  Fax: [phone]  Weight Summary And Biometrics  Vitals Temp: 97.7 F (36.5 C) BP: 104/72 Pulse Rate: 87 SpO2: 97 %   Anthropometric Measurements Height: 5\' 7"  (1.702 m) Weight: 237 lb (107.5 kg) BMI (Calculated): 37.11 Weight at Last Visit: 243 lb Weight Lost Since Last Visit: 6 lb Weight Gained Since Last Visit: 0 Starting Weight: 275 lb Total Weight Loss (lbs): 38 lb (17.2 kg) Peak Weight: 300 lb   Body Composition  Body Fat %: 45.3 % Fat Mass (lbs): 107.4 lbs Muscle Mass (lbs): 123.2 lbs Total Body Water (lbs): 86.7 lbs Visceral Fat Rating : 13    No data recorded Today's Visit #: 14  Starting Date: 03/02/22   Subjective   Chief Complaint: Obesity  Interval History Discussed the use of AI scribe software for clinical note transcription with the patient, who gave verbal consent to proceed.  History of Present Illness   [name] [name] is a 53 year old female who presents for medical weight management.  She has lost six pounds since her last visit and adheres to her reduced calorie nutrition plan 80% of the time. Her diet includes more whole foods, increased vegetable intake, adequate protein, and hydration, without skipping meals. She engages in physical activity by walking six days a week for about 60 minutes, focusing on cardio without strengthening exercises.  She has made significant lifestyle changes, including eliminating alcohol and empty calories, and is cultivating her own vegetables to ensure fresh and healthy food options. Her fluid intake consists mainly of water, coffee, and tea with turmeric and ginger.  She is currently on Mounjaro 12.5 mg and has not experienced significant symptoms of hypoglycemia, though she notes an incident where a sugary drink caused symptoms. Recent lab work shows an LDL of 96, triglycerides at 80, fasting blood sugar at 92, and an A1c of 5.0, down from 5.9 in June. Her vitamin D level is 44,  thyroid hormone is 1.27, and B12 level is 702. Her insulin level has improved to 19.4 from 23.8. She has lost a total of 60 pounds, approximately 21% of her body weight, and her BMI has decreased from 43 to 37. She is taking rosuvastatin for cholesterol management and a multivitamin.  She reports improved sleep, getting about seven to nine hours per night, and uses CPAP for her sleep apnea. Her weight loss may contribute to further improvements in sleep apnea symptoms. No high levels of stress are reported.  A recent procedure to burn nerves in her back has reduced her chronic pain, allowing her to sleep better and walk longer. However, she still performs chores in spurts due to residual discomfort.        Challenges affecting patient progress: orthopedic problems, medical conditions or chronic pain affecting mobility.    Pharmacotherapy for weight management: She is currently taking Monjauro with diabetes as the primary indication with adequate clinical response  and without side effects..   Assessment and Plan   Treatment Plan For Obesity:  Recommended Dietary Goals  [name] [name] is currently in the action stage of change. As such, her goal is to continue weight management plan. She has agreed to: continue current plan  Behavioral Health and Counseling  We discussed the following behavioral modification strategies today: continue to work on maintaining a reduced calorie state, getting the recommended amount of protein, incorporating whole foods, making healthy choices, staying well hydrated and practicing mindfulness when eating..  Additional education and resources provided today: None  Recommended Physical Activity Goals  [name] [name] has been advised to work up to 150 minutes of moderate intensity aerobic activity a week and strengthening exercises 2-3 times per week for cardiovascular health, weight loss maintenance and preservation of muscle mass.   She has agreed to :  continue to gradually  increase the amount and intensity of exercise routine  Pharmacotherapy  We discussed various medication options to help [name] [name] with her weight loss efforts and we both agreed to : adequate clinical response to current dose, continue current regimen  Associated Conditions Impacted by Obesity Treatment  OSA on CPAP Assessment & Plan: She uses CPAP therapy and reports improved sleep quality. Weight loss and lifestyle changes may further improve her sleep apnea management. - Continue CPAP therapy - Monitor sleep quality and symptoms    Type 2 diabetes mellitus with other specified complication, without long-term current use of insulin (HCC) Assessment & Plan: Her most recent A1c was 5.0 based on external labs from February of this year.  Her insulin levels have improved.  She had a few episodes of hypoglycemia which have been self managed.  We reviewed recognition and management of hypoglycemia today.  It is rare to have hypoglycemia with GLP-1 therapy alone but may occur in 10% of people.  We encouraged her to self monitor and report any more hypoglycemic spells.  Orders: -     Tirzepatide; Inject 12.5 mg into the skin once a week.  Dispense: 2 mL; Refill: 1  Pure hypercholesterolemia Assessment & Plan: Her LDL cholesterol has slightly increased from 82 to 96, but triglycerides remain at 80. She is taking rosuvastatin for cholesterol management as part of her cardiovascular risk reduction strategy. - Continue rosuvastatin - Monitor lipid profile regularly   Class 3 severe obesity with serious comorbidity and body mass index (BMI) of 40.0 to 44.9 in adult, unspecified obesity type [facility]) Assessment & Plan: She has lost 60 pounds, approximately 21% of her body weight, reducing her BMI from 43 to 37. She follows a reduced calorie nutrition plan and engages in regular physical activity, including walking six days a week for 60 minutes. She incorporates more whole foods and eliminates empty  calories. Mounjaro 12.5 mg has contributed to her weight loss, and her A1c has improved from 5.9 to 5.0, indicating better glycemic control. She aims to reach 200 pounds to qualify for hernia surgery. Mounjaro typically results in 15-20% weight loss in patients with diabetes, and she has achieved a 21% reduction, indicating a positive response.  - Continue Mounjaro 12.5 mg - Adhere to reduced calorie nutrition plan - Continue regular physical activity - Monitor weight and BMI - Discuss potential hernia surgery when weight goal is achieved - Educate on hypoglycemia management and ensure access to rapid-acting sugar               Objective   Physical Exam:  Blood pressure 104/72, pulse 87, temperature 97.7 F (36.5 C), height 5\' 7"  (1.702 m), weight 237 lb (107.5 kg), SpO2 97%. Body mass index is 37.12 kg/m.  General: She is overweight, cooperative, alert, well developed, and in no acute distress. PSYCH: Has normal mood, affect and thought process.   HEENT: EOMI, sclerae are anicteric. Lungs: Normal breathing effort, no conversational dyspnea. Extremities: No edema.  Neurologic: No gross sensory or motor deficits. No tremors or fasciculations noted.    Diagnostic Data Reviewed:  BMET    Component Value Date/Time   NA 139 03/02/2022 1034   K 4.6 03/02/2022 1034  CL 104 03/02/2022 1034   CO2 19 (L) 03/02/2022 1034   GLUCOSE 90 03/02/2022 1034   GLUCOSE 101 (H) 07/10/2015 1552   BUN 9 03/02/2022 1034   CREATININE 0.74 03/02/2022 1034   CALCIUM 9.8 03/02/2022 1034   GFRNONAA >60 07/10/2015 1552   GFRAA >60 07/10/2015 1552   Lab Results  Component Value Date   HGBA1C 5.9 (H) 06/20/2022   HGBA1C 5.8 (H) 03/02/2022   Lab Results  Component Value Date   INSULIN 23.8 03/02/2022   Lab Results  Component Value Date   TSH 1.330 03/02/2022   CBC    Component Value Date/Time   WBC 5.5 03/02/2022 1034   WBC 6.8 07/10/2015 1552   RBC 4.91 03/02/2022 1034   RBC 4.73  07/10/2015 1552   HGB 13.3 03/02/2022 1034   HCT 41.9 03/02/2022 1034   PLT 360 03/02/2022 1034   MCV 85 03/02/2022 1034   MCH 27.1 03/02/2022 1034   MCH 26.6 07/10/2015 1552   MCHC 31.7 03/02/2022 1034   MCHC 32.4 07/10/2015 1552   RDW 13.3 03/02/2022 1034   Iron Studies No results found for: "IRON", "TIBC", "FERRITIN", "IRONPCTSAT" Lipid Panel     Component Value Date/Time   CHOL 139 03/02/2022 1034   TRIG 93 03/02/2022 1034   HDL 39 (L) 03/02/2022 1034   LDLCALC 82 03/02/2022 1034   Hepatic Function Panel     Component Value Date/Time   PROT 7.4 03/02/2022 1034   ALBUMIN 4.7 03/02/2022 1034   AST 22 03/02/2022 1034   ALT 21 03/02/2022 1034   ALKPHOS 69 03/02/2022 1034   BILITOT 0.3 03/02/2022 1034      Component Value Date/Time   TSH 1.330 03/02/2022 1034   Nutritional Lab Results  Component Value Date   VD25OH 31.4 06/20/2022   VD25OH 26.9 (L) 03/02/2022    Medications: Outpatient Encounter Medications as of 03/21/2023  Medication Sig   bisacodyl (DULCOLAX) 5 MG EC tablet Take 1 tablet (5 mg total) by mouth daily as needed for moderate constipation.   Cholecalciferol (VITAMIN D3) 50 MCG (2000 UT) CAPS Take 1 capsule (2,000 Units total) by mouth daily.   Multiple Vitamins-Minerals (CENTRUM MINIS ADULTS 50+ PO) Take by mouth.   Multiple Vitamins-Minerals (OCUVITE ADULT 50+) CAPS Take by mouth.   NON FORMULARY Allergy shot twice weekly   omeprazole (PRILOSEC) 40 MG capsule Take 40 mg by mouth daily.   polyethylene glycol powder (GLYCOLAX/MIRALAX) 17 GM/SCOOP powder Take 17 g by mouth 2 (two) times daily as needed.   rosuvastatin (CRESTOR) 20 MG tablet Take 20 mg by mouth daily.   tranexamic acid (LYSTEDA) 650 MG TABS tablet Take 650 mg by mouth as needed (takes 2 tablets 3 times a day prn).   [DISCONTINUED] tirzepatide (MOUNJARO) 12.5 MG/0.5ML Pen Inject 12.5 mg into the skin once a week.   tirzepatide (MOUNJARO) 12.5 MG/0.5ML Pen Inject 12.5 mg into the skin  once a week.   No facility-administered encounter medications on file as of 03/21/2023.     Follow-Up   Return in about 4 weeks (around 04/18/2023) for For Weight Mangement with Dr. Rikki Spearing.Marland Kitchen She was informed of the importance of frequent follow up visits to maximize her success with intensive lifestyle modifications for her multiple health conditions.  Attestation Statement   Reviewed by clinician on day of visit: allergies, medications, problem list, medical history, surgical history, family history, social history, and previous encounter notes.     Worthy Rancher, MD

## 2023-04-11 DIAGNOSIS — M533 Sacrococcygeal disorders, not elsewhere classified: Secondary | ICD-10-CM | POA: Insufficient documentation

## 2023-04-19 ENCOUNTER — Encounter (INDEPENDENT_AMBULATORY_CARE_PROVIDER_SITE_OTHER): Payer: Self-pay | Admitting: Internal Medicine

## 2023-04-19 ENCOUNTER — Ambulatory Visit (INDEPENDENT_AMBULATORY_CARE_PROVIDER_SITE_OTHER): Admitting: Internal Medicine

## 2023-04-19 VITALS — BP 111/72 | HR 99 | Temp 97.8°F | Ht 67.0 in | Wt 237.0 lb

## 2023-04-19 DIAGNOSIS — Z7985 Long-term (current) use of injectable non-insulin antidiabetic drugs: Secondary | ICD-10-CM

## 2023-04-19 DIAGNOSIS — E66813 Obesity, class 3: Secondary | ICD-10-CM | POA: Diagnosis not present

## 2023-04-19 DIAGNOSIS — Z6841 Body Mass Index (BMI) 40.0 and over, adult: Secondary | ICD-10-CM

## 2023-04-19 DIAGNOSIS — G4733 Obstructive sleep apnea (adult) (pediatric): Secondary | ICD-10-CM | POA: Diagnosis not present

## 2023-04-19 DIAGNOSIS — E1169 Type 2 diabetes mellitus with other specified complication: Secondary | ICD-10-CM

## 2023-04-19 MED ORDER — TIRZEPATIDE 12.5 MG/0.5ML ~~LOC~~ SOAJ: 12.5000 mg | SUBCUTANEOUS | 1 refills | Status: DC

## 2023-04-19 NOTE — Assessment & Plan Note (Signed)
 Her most recent A1c was 5.0 based on external labs from February of this year.  Her insulin levels have improved.  She is no longer having hypoglycemia.  Continue current dose of Mounjaro.  She will work with her insurance on coverage she is under the impression that because of improvements made that she no longer qualifies for medication I feel that this will be a major setback and will result in weight regain and exacerbation of her obesity related conditions including OSA and diabetes.

## 2023-04-19 NOTE — Progress Notes (Signed)
 Office: [phone]  /  Fax: [phone]  Weight Summary And Biometrics  Vitals Temp: 97.8 F (36.6 C) BP: 111/72 Pulse Rate: 99 SpO2: 97 %   Anthropometric Measurements Height: 5\' 7"  (1.702 m) Weight: 237 lb (107.5 kg) BMI (Calculated): 37.11 Weight at Last Visit: 237 lb Weight Lost Since Last Visit: 0 lb Weight Gained Since Last Visit: 0 lb Starting Weight: 275 lb Total Weight Loss (lbs): 38 lb (17.2 kg) Peak Weight: 300 lb   Body Composition  Body Fat %: 38.9 % Fat Mass (lbs): 92.2 lbs Muscle Mass (lbs): 137.6 lbs Total Body Water (lbs): 89.4 lbs Visceral Fat Rating : 11    No data recorded Today's Visit #: 15  Starting Date: 03/02/22   Subjective   Chief Complaint: Obesity  Interval History Discussed the use of AI scribe software for clinical note transcription with the patient, who gave verbal consent to proceed.  History of Present Illness   [name] [name] is a 53 year old female with type 2 diabetes, sleep apnea, and class three obesity who presents for medical weight management.  She follows a category three meal plan approximately 70-80% of the time without tracking calories. She maintains adequate protein intake and hydration but occasionally skips meals. She exercises five days a week for about 30 minutes, incorporating strength training, cardio, and yoga.  She experiences gastrointestinal discomfort after eating out, attributing it to changes in her gut microbiome and dietary incompatibility with certain foods. She feels sick and bloated after consuming meals outside her home, particularly those containing eggs, sugar, or fatty foods. She has been eating out more frequently due to circumstances requiring her to be away from home.  She has been tracking her weight and noticed a slight increase, which she attributes to water retention and her menstrual cycle. Despite this, she has observed a decrease in body fat and an increase in muscle mass.  She  has a history of type 2 diabetes, sleep apnea, and class three obesity, along with orthopedic problems affected by her weight. She is concerned about her insurance potentially discontinuing coverage for her medications, which she believes have significantly contributed to her weight loss and improved health.  She reports significant weight loss, evidenced by her engagement ring needing to be resized down by four sizes. She is concerned about maintaining her current medication regimen to continue her weight loss journey and eventually qualify for surgery.       Challenges affecting patient progress: orthopedic problems, medical conditions or chronic pain affecting mobility, inadequate fluid intake, and menopause.    Pharmacotherapy for weight management: She is currently taking Monjauro with diabetes as the primary indication with adequate clinical response  and without side effects..  Patient is concerned about losing coverage for medication even though she has a history of diabetes which has improved on GLP-1.  Assessment and Plan   Treatment Plan For Obesity:  Recommended Dietary Goals  Crystall is currently in the action stage of change. As such, her goal is to continue weight management plan. She has agreed to: follow the Category 3 plan - 1500 kcal per day and continue current plan  Behavioral Health and Counseling  We discussed the following behavioral modification strategies today: continue to work on maintaining a reduced calorie state, getting the recommended amount of protein, incorporating whole foods, making healthy choices, staying well hydrated and practicing mindfulness when eating..  Additional education and resources provided today: None  Recommended Physical Activity Goals  Jahlia has been  advised to work up to 150 minutes of moderate intensity aerobic activity a week and strengthening exercises 2-3 times per week for cardiovascular health, weight loss maintenance and  preservation of muscle mass.   She has agreed to :  Think about enjoyable ways to increase daily physical activity and overcoming barriers to exercise and Increase physical activity in their day and reduce sedentary time (increase NEAT).  Pharmacotherapy  We discussed various medication options to help Christene with her weight loss efforts and we both agreed to : adequate clinical response to anti-obesity medication, continue current regimen and do not recommend discontinuation because of insurance issues this would create setbacks in patients progress.  Her diabetes is now well-controlled this is also helping with her sleep apnea and has aided her in her weight loss journey and also quality of life  Associated Conditions Impacted by Obesity Treatment  OSA on CPAP Assessment & Plan: She uses CPAP therapy and reports improved sleep quality. Weight loss and lifestyle changes may further improve her sleep apnea management. - Continue CPAP therapy - Continue GLP-1 therapy to aid in weight management and diabetes control   Type 2 diabetes mellitus with other specified complication, without long-term current use of insulin (HCC) Assessment & Plan: Her most recent A1c was 5.0 based on external labs from February of this year.  Her insulin levels have improved.  She is no longer having hypoglycemia.  Continue current dose of Mounjaro.  She will work with her insurance on coverage she is under the impression that because of improvements made that she no longer qualifies for medication I feel that this will be a major setback and will result in weight regain and exacerbation of her obesity related conditions including OSA and diabetes.  Orders: -     Tirzepatide; Inject 12.5 mg into the skin once a week.  Dispense: 2 mL; Refill: 1  Class 3 severe obesity with serious comorbidity and body mass index (BMI) of 40.0 to 44.9 in adult, unspecified obesity type [facility]) Assessment & Plan: She has lost a total of  63 pounds, approximately 21% of her body weight, reducing her BMI from 43 to 37. She follows a reduced calorie nutrition plan and engages in regular physical activity, including walking six days a week for 60 minutes. She incorporates more whole foods and eliminates empty calories. Mounjaro 12.5 mg has contributed to her weight loss, and her A1c has improved from 5.9 to 5.0, indicating better glycemic control. She aims to reach 200 pounds to qualify for hernia surgery.  Mounjaro has been instrumental in helping her with her diabetes and losing weight. - Continue Mounjaro 12.5 mg and avoid discontinuation because of insurance preference.  Advised to appeal decision as this will negatively impact her health. - Adhere to reduced calorie nutrition plan - Continue regular physical activity              Objective   Physical Exam:  Blood pressure 111/72, pulse 99, temperature 97.8 F (36.6 C), height 5\' 7"  (1.702 m), weight 237 lb (107.5 kg), last menstrual period 04/12/2023, SpO2 97%. Body mass index is 37.12 kg/m.  General: She is overweight, cooperative, alert, well developed, and in no acute distress. PSYCH: Has normal mood, affect and thought process.   HEENT: EOMI, sclerae are anicteric. Lungs: Normal breathing effort, no conversational dyspnea. Extremities: No edema.  Neurologic: No gross sensory or motor deficits. No tremors or fasciculations noted.    Diagnostic Data Reviewed:  BMET    Component  Value Date/Time   NA 139 02/15/2023 0000   K 4.5 02/15/2023 0000   CL 105 02/15/2023 0000   CO2 25 (A) 02/15/2023 0000   GLUCOSE 90 03/02/2022 1034   GLUCOSE 101 (H) 07/10/2015 1552   BUN 9 03/02/2022 1034   CREATININE 0.8 02/15/2023 0000   CREATININE 0.74 03/02/2022 1034   CALCIUM 10.1 02/15/2023 0000   GFRNONAA >60 07/10/2015 1552   GFRAA >60 07/10/2015 1552   Lab Results  Component Value Date   HGBA1C 5.9 (H) 06/20/2022   HGBA1C 5.8 (H) 03/02/2022   Lab Results   Component Value Date   INSULIN 23.8 03/02/2022   Lab Results  Component Value Date   TSH 1.27 02/15/2023   CBC    Component Value Date/Time   WBC 5.9 02/15/2023 0000   WBC 6.8 07/10/2015 1552   RBC 4.83 02/15/2023 0000   HGB 13.3 03/02/2022 1034   HCT 41.9 03/02/2022 1034   PLT 360 03/02/2022 1034   MCV 85 03/02/2022 1034   MCH 27.1 03/02/2022 1034   MCH 26.6 07/10/2015 1552   MCHC 31.7 03/02/2022 1034   MCHC 32.4 07/10/2015 1552   RDW 13.3 03/02/2022 1034   Iron Studies No results found for: "IRON", "TIBC", "FERRITIN", "IRONPCTSAT" Lipid Panel     Component Value Date/Time   CHOL 152 02/15/2023 0000   CHOL 139 03/02/2022 1034   TRIG 80 02/15/2023 0000   HDL 40 02/15/2023 0000   HDL 39 (L) 03/02/2022 1034   LDLCALC 96 02/15/2023 0000   LDLCALC 82 03/02/2022 1034   Hepatic Function Panel     Component Value Date/Time   PROT 7.4 03/02/2022 1034   ALBUMIN 4.7 03/02/2022 1034   AST 23 02/15/2023 0000   ALT 22 02/15/2023 0000   ALKPHOS 71 02/15/2023 0000   BILITOT 0.3 03/02/2022 1034      Component Value Date/Time   TSH 1.27 02/15/2023 0000   TSH 1.330 03/02/2022 1034   Nutritional Lab Results  Component Value Date   VD25OH 44.1 02/15/2023   VD25OH 31.4 06/20/2022   VD25OH 26.9 (L) 03/02/2022    Medications: Outpatient Encounter Medications as of 04/19/2023  Medication Sig   bisacodyl (DULCOLAX) 5 MG EC tablet Take 1 tablet (5 mg total) by mouth daily as needed for moderate constipation.   Cholecalciferol (VITAMIN D3) 50 MCG (2000 UT) CAPS Take 1 capsule (2,000 Units total) by mouth daily.   Multiple Vitamins-Minerals (CENTRUM MINIS ADULTS 50+ PO) Take by mouth.   Multiple Vitamins-Minerals (OCUVITE ADULT 50+) CAPS Take by mouth.   NON FORMULARY Allergy shot twice weekly   omeprazole (PRILOSEC) 40 MG capsule Take 40 mg by mouth daily.   polyethylene glycol powder (GLYCOLAX/MIRALAX) 17 GM/SCOOP powder Take 17 g by mouth 2 (two) times daily as needed.    rosuvastatin (CRESTOR) 20 MG tablet Take 20 mg by mouth daily.   tranexamic acid (LYSTEDA) 650 MG TABS tablet Take 650 mg by mouth as needed (takes 2 tablets 3 times a day prn).   [DISCONTINUED] tirzepatide (MOUNJARO) 12.5 MG/0.5ML Pen Inject 12.5 mg into the skin once a week.   tirzepatide (MOUNJARO) 12.5 MG/0.5ML Pen Inject 12.5 mg into the skin once a week.   No facility-administered encounter medications on file as of 04/19/2023.     Follow-Up   Return in about 4 weeks (around 05/17/2023) for For Weight Mangement with Dr. Allie Area.Aaron Aas She was informed of the importance of frequent follow up visits to maximize her success with intensive lifestyle  modifications for her multiple health conditions.  Attestation Statement   Reviewed by clinician on day of visit: allergies, medications, problem list, medical history, surgical history, family history, social history, and previous encounter notes.     Ladd Picker, MD

## 2023-04-19 NOTE — Assessment & Plan Note (Signed)
 She uses CPAP therapy and reports improved sleep quality. Weight loss and lifestyle changes may further improve her sleep apnea management. - Continue CPAP therapy - Continue GLP-1 therapy to aid in weight management and diabetes control

## 2023-04-19 NOTE — Assessment & Plan Note (Addendum)
 She has lost a total of 63 pounds, approximately 21% of her body weight, reducing her BMI from 43 to 37. She follows a reduced calorie nutrition plan and engages in regular physical activity, including walking six days a week for 60 minutes. She incorporates more whole foods and eliminates empty calories. Mounjaro 12.5 mg has contributed to her weight loss, and her A1c has improved from 5.9 to 5.0, indicating better glycemic control. She aims to reach 200 pounds to qualify for hernia surgery.  Mounjaro has been instrumental in helping her with her diabetes and losing weight. - Continue Mounjaro 12.5 mg and avoid discontinuation because of insurance preference.  Advised to appeal decision as this will negatively impact her health. - Adhere to reduced calorie nutrition plan - Continue regular physical activity

## 2023-05-22 DIAGNOSIS — E785 Hyperlipidemia, unspecified: Secondary | ICD-10-CM | POA: Insufficient documentation

## 2023-05-22 DIAGNOSIS — J3081 Allergic rhinitis due to animal (cat) (dog) hair and dander: Secondary | ICD-10-CM | POA: Insufficient documentation

## 2023-05-22 DIAGNOSIS — J301 Allergic rhinitis due to pollen: Secondary | ICD-10-CM | POA: Insufficient documentation

## 2023-05-22 DIAGNOSIS — K219 Gastro-esophageal reflux disease without esophagitis: Secondary | ICD-10-CM | POA: Insufficient documentation

## 2023-05-22 DIAGNOSIS — E669 Obesity, unspecified: Secondary | ICD-10-CM | POA: Insufficient documentation

## 2023-05-22 DIAGNOSIS — J309 Allergic rhinitis, unspecified: Secondary | ICD-10-CM | POA: Insufficient documentation

## 2023-05-22 DIAGNOSIS — J4520 Mild intermittent asthma, uncomplicated: Secondary | ICD-10-CM | POA: Insufficient documentation

## 2023-05-22 DIAGNOSIS — H547 Unspecified visual loss: Secondary | ICD-10-CM | POA: Insufficient documentation

## 2023-05-23 DIAGNOSIS — H9201 Otalgia, right ear: Secondary | ICD-10-CM | POA: Insufficient documentation

## 2023-05-30 ENCOUNTER — Encounter (INDEPENDENT_AMBULATORY_CARE_PROVIDER_SITE_OTHER): Payer: Self-pay | Admitting: Internal Medicine

## 2023-05-30 ENCOUNTER — Ambulatory Visit (INDEPENDENT_AMBULATORY_CARE_PROVIDER_SITE_OTHER): Admitting: Internal Medicine

## 2023-05-30 VITALS — BP 105/72 | HR 84 | Temp 97.8°F | Ht 67.0 in | Wt 230.0 lb

## 2023-05-30 DIAGNOSIS — E7800 Pure hypercholesterolemia, unspecified: Secondary | ICD-10-CM

## 2023-05-30 DIAGNOSIS — E66812 Obesity, class 2: Secondary | ICD-10-CM | POA: Insufficient documentation

## 2023-05-30 DIAGNOSIS — Z7985 Long-term (current) use of injectable non-insulin antidiabetic drugs: Secondary | ICD-10-CM

## 2023-05-30 DIAGNOSIS — E1169 Type 2 diabetes mellitus with other specified complication: Secondary | ICD-10-CM | POA: Diagnosis not present

## 2023-05-30 DIAGNOSIS — Z6836 Body mass index (BMI) 36.0-36.9, adult: Secondary | ICD-10-CM

## 2023-05-30 DIAGNOSIS — G4733 Obstructive sleep apnea (adult) (pediatric): Secondary | ICD-10-CM

## 2023-05-30 MED ORDER — TIRZEPATIDE 12.5 MG/0.5ML ~~LOC~~ SOAJ: 12.5000 mg | SUBCUTANEOUS | 1 refills | Status: DC

## 2023-05-30 NOTE — Progress Notes (Signed)
 Office: [phone]  /  Fax: [phone]  Weight Summary And Biometrics  Vitals Temp: 97.8 F (36.6 C) BP: 105/72 Pulse Rate: 84 SpO2: 98 %   Anthropometric Measurements Height: 5\' 7"  (1.702 m) Weight: 230 lb (104.3 kg) BMI (Calculated): 36.01 Weight at Last Visit: 237lb Weight Lost Since Last Visit: 7lb Weight Gained Since Last Visit: 0lb Starting Weight: 275lb Total Weight Loss (lbs): 45 lb (20.4 kg) Peak Weight: 300lb   Body Composition  Body Fat %: 44.9 % Fat Mass (lbs): 103.6 lbs Muscle Mass (lbs): 120.8 lbs Total Body Water (lbs): 87 lbs Visceral Fat Rating : 12    No data recorded Today's Visit #: 16  Starting Date: 03/02/22   Subjective   Chief Complaint: Obesity  Interval History Discussed the use of AI scribe software for clinical note transcription with the patient, who gave verbal consent to proceed.  History of Present Illness   [name] [name] is a 53 year old female with type two diabetes and hypercholesterolemia who presents for medical weight management.  She has lost seven pounds since her last visit and follows a 1500 calorie meal plan with adherence of 78-80%. Her basal metabolic rate is approximately 1780 calories based on her new weight. She incorporates whole foods, ensures adequate protein intake, maintains hydration, and does not skip meals. She exercises seven days a week for about 40 minutes each session, reports adequate sleep, and no high levels of stress.  She has a history of sleep apnea and uses a CPAP machine, which helps her sleep well. She also has type two diabetes and hypercholesterolemia and is currently on a statin. She leads an active lifestyle, engaging in activities such as gardening and walking, which she feels has contributed to her weight loss and overall well-being. She feels better after receiving steroid injections in her joints, which have increased her energy levels and activity.  Her dietary habits have changed  significantly, with a preference for home-cooked meals and fresh produce. She reports a decreased desire for eating out and processed foods, noting a shift towards spicier flavors. She also takes a multivitamin regularly.  Family history is significant for cardiovascular disease, as her father died of a massive heart attack at the age of 78.       Challenges affecting patient progress: orthopedic problems, medical conditions or chronic pain affecting mobility and medical comorbidities.    Pharmacotherapy for weight management: She is currently taking Monjauro with diabetes as the primary indication with adequate clinical response  and without side effects..   Assessment and Plan   Treatment Plan For Obesity:  Recommended Dietary Goals  [name] is currently in the action stage of change. As such, her goal is to continue weight management plan. She has agreed to: continue current plan  Behavioral Health and Counseling  We discussed the following behavioral modification strategies today: continue to work on maintaining a reduced calorie state, getting the recommended amount of protein, incorporating whole foods, making healthy choices, staying well hydrated and practicing mindfulness when eating..  Additional education and resources provided today: None  Recommended Physical Activity Goals  [name] has been advised to work up to 150 minutes of moderate intensity aerobic activity a week and strengthening exercises 2-3 times per week for cardiovascular health, weight loss maintenance and preservation of muscle mass.   She has agreed to :  Start strengthening exercises with a goal of 2-3 sessions a week   Pharmacotherapy  We discussed various medication options to help [employer]  with her weight loss efforts and we both agreed to : adequate clinical response to anti-obesity medication, continue current regimen  Associated Conditions Impacted by Obesity Treatment  OSA on CPAP Assessment &  Plan: On CPAP with reported good compliance. Continue PAP therapy. Losing 15% or more of body weight may improve AHI.    - Continue CPAP therapy - Continue GLP-1 therapy to aid in weight management and diabetes control   Type 2 diabetes mellitus with other specified complication, without long-term current use of insulin  [facility]) Assessment & Plan: Unfortunately I was unable to download most recent information from her PCPs office.  I recommend that she print out most recent labs.  Type 2 diabetes mellitus is managed with lifestyle modifications and Mounjaro  12.5 mg.  She discussed potential to wean off statins with PCP due to improved cholesterol levels, but cardiovascular risk remains due to diabetes and family history of heart disease.  - Continue Mounjaro  at 12.5 mg. - Bring a printout of recent lab results to the next appointment. -Continue current nutritional strategy  Orders: -     Tirzepatide ; Inject 12.5 mg into the skin once a week.  Dispense: 2 mL; Refill: 1  Class 2 severe obesity with serious comorbidity and body mass index (BMI) of 36.0 to 36.9 in adult, unspecified obesity type [facility]) Assessment & Plan: She has lost 45 pounds since starting her program but overall has lost 70 considering her peak weight of 300 pounds.  Her goal is to get down to 200 to have hernia surgery.  Continue with current weight management strategy.  - Prioritize strengthening exercise for muscle mass preservation -Continue current dose of Mounjaro  for type 2 diabetes and weight management - Continue multivitamin with minerals    Pure hypercholesterolemia Assessment & Plan: Her LDL cholesterol has slightly increased from 82 to 96, but triglycerides remain at 80. She is taking rosuvastatin for cholesterol management as part of her cardiovascular risk reduction strategy. - Continue rosuvastatin - Monitor lipid profile regularly            Objective   Physical Exam:  Blood pressure 105/72,  pulse 84, temperature 97.8 F (36.6 C), height 5\' 7"  (1.702 m), weight 230 lb (104.3 kg), SpO2 98%. Body mass index is 36.02 kg/m.  General: She is overweight, cooperative, alert, well developed, and in no acute distress. PSYCH: Has normal mood, affect and thought process.   HEENT: EOMI, sclerae are anicteric. Lungs: Normal breathing effort, no conversational dyspnea. Extremities: No edema.  Neurologic: No gross sensory or motor deficits. No tremors or fasciculations noted.    Diagnostic Data Reviewed:  BMET    Component Value Date/Time   NA 139 02/15/2023 0000   K 4.5 02/15/2023 0000   CL 105 02/15/2023 0000   CO2 25 (A) 02/15/2023 0000   GLUCOSE 90 03/02/2022 1034   GLUCOSE 101 (H) 07/10/2015 1552   BUN 9 03/02/2022 1034   CREATININE 0.8 02/15/2023 0000   CREATININE 0.74 03/02/2022 1034   CALCIUM 10.1 02/15/2023 0000   GFRNONAA >60 07/10/2015 1552   GFRAA >60 07/10/2015 1552   Lab Results  Component Value Date   HGBA1C 5.9 (H) 06/20/2022   HGBA1C 5.8 (H) 03/02/2022   Lab Results  Component Value Date   INSULIN  23.8 03/02/2022   Lab Results  Component Value Date   TSH 1.27 02/15/2023   CBC    Component Value Date/Time   WBC 5.9 02/15/2023 0000   WBC 6.8 07/10/2015 1552   RBC 4.83  02/15/2023 0000   HGB 13.3 03/02/2022 1034   HCT 41.9 03/02/2022 1034   PLT 360 03/02/2022 1034   MCV 85 03/02/2022 1034   MCH 27.1 03/02/2022 1034   MCH 26.6 07/10/2015 1552   MCHC 31.7 03/02/2022 1034   MCHC 32.4 07/10/2015 1552   RDW 13.3 03/02/2022 1034   Iron Studies No results found for: "IRON", "TIBC", "FERRITIN", "IRONPCTSAT" Lipid Panel     Component Value Date/Time   CHOL 152 02/15/2023 0000   CHOL 139 03/02/2022 1034   TRIG 80 02/15/2023 0000   HDL 40 02/15/2023 0000   HDL 39 (L) 03/02/2022 1034   LDLCALC 96 02/15/2023 0000   LDLCALC 82 03/02/2022 1034   Hepatic Function Panel     Component Value Date/Time   PROT 7.4 03/02/2022 1034   ALBUMIN 4.7  03/02/2022 1034   AST 23 02/15/2023 0000   ALT 22 02/15/2023 0000   ALKPHOS 71 02/15/2023 0000   BILITOT 0.3 03/02/2022 1034      Component Value Date/Time   TSH 1.27 02/15/2023 0000   TSH 1.330 03/02/2022 1034   Nutritional Lab Results  Component Value Date   VD25OH 44.1 02/15/2023   VD25OH 31.4 06/20/2022   VD25OH 26.9 (L) 03/02/2022    Medications: Outpatient Encounter Medications as of 05/30/2023  Medication Sig   bisacodyl  (DULCOLAX) 5 MG EC tablet Take 1 tablet (5 mg total) by mouth daily as needed for moderate constipation.   Cholecalciferol (VITAMIN D3) 50 MCG (2000 UT) CAPS Take 1 capsule (2,000 Units total) by mouth daily.   Multiple Vitamins-Minerals (CENTRUM MINIS ADULTS 50+ PO) Take by mouth.   Multiple Vitamins-Minerals (OCUVITE ADULT 50+) CAPS Take by mouth.   NON FORMULARY Allergy shot twice weekly   omeprazole (PRILOSEC) 40 MG capsule Take 40 mg by mouth daily.   polyethylene glycol powder (GLYCOLAX /MIRALAX ) 17 GM/SCOOP powder Take 17 g by mouth 2 (two) times daily as needed.   rosuvastatin (CRESTOR) 20 MG tablet Take 20 mg by mouth daily.   tranexamic acid (LYSTEDA) 650 MG TABS tablet Take 650 mg by mouth as needed (takes 2 tablets 3 times a day prn).   [DISCONTINUED] tirzepatide  (MOUNJARO ) 12.5 MG/0.5ML Pen Inject 12.5 mg into the skin once a week.   tirzepatide  (MOUNJARO ) 12.5 MG/0.5ML Pen Inject 12.5 mg into the skin once a week.   No facility-administered encounter medications on file as of 05/30/2023.     Follow-Up   Return in about 4 weeks (around 06/27/2023) for Schedule future 3-4 week follow-up.Aaron Aas She was informed of the importance of frequent follow up visits to maximize her success with intensive lifestyle modifications for her multiple health conditions.  Attestation Statement   Reviewed by clinician on day of visit: allergies, medications, problem list, medical history, surgical history, family history, social history, and previous encounter  notes.     Ladd Picker, MD

## 2023-05-30 NOTE — Assessment & Plan Note (Signed)
 Unfortunately I was unable to download most recent information from her PCPs office.  I recommend that she print out most recent labs.  Type 2 diabetes mellitus is managed with lifestyle modifications and Mounjaro  12.5 mg.  She discussed potential to wean off statins with PCP due to improved cholesterol levels, but cardiovascular risk remains due to diabetes and family history of heart disease.  - Continue Mounjaro  at 12.5 mg. - Bring a printout of recent lab results to the next appointment. -Continue current nutritional strategy

## 2023-05-30 NOTE — Assessment & Plan Note (Signed)
 She has lost 45 pounds since starting her program but overall has lost 70 considering her peak weight of 300 pounds.  Her goal is to get down to 200 to have hernia surgery.  Continue with current weight management strategy.  - Prioritize strengthening exercise for muscle mass preservation -Continue current dose of Mounjaro  for type 2 diabetes and weight management - Continue multivitamin with minerals

## 2023-05-30 NOTE — Assessment & Plan Note (Signed)
 Her LDL cholesterol has slightly increased from 82 to 96, but triglycerides remain at 80. She is taking rosuvastatin for cholesterol management as part of her cardiovascular risk reduction strategy. - Continue rosuvastatin - Monitor lipid profile regularly

## 2023-05-30 NOTE — Assessment & Plan Note (Signed)
 On CPAP with reported good compliance. Continue PAP therapy. Losing 15% or more of body weight may improve AHI.    - Continue CPAP therapy - Continue GLP-1 therapy to aid in weight management and diabetes control

## 2023-06-12 DIAGNOSIS — L237 Allergic contact dermatitis due to plants, except food: Secondary | ICD-10-CM | POA: Insufficient documentation

## 2023-06-26 ENCOUNTER — Encounter (INDEPENDENT_AMBULATORY_CARE_PROVIDER_SITE_OTHER): Payer: Self-pay | Admitting: Internal Medicine

## 2023-06-26 ENCOUNTER — Ambulatory Visit (INDEPENDENT_AMBULATORY_CARE_PROVIDER_SITE_OTHER): Admitting: Internal Medicine

## 2023-06-26 VITALS — BP 101/60 | HR 80 | Temp 98.2°F | Ht 67.0 in | Wt 230.0 lb

## 2023-06-26 DIAGNOSIS — Z7985 Long-term (current) use of injectable non-insulin antidiabetic drugs: Secondary | ICD-10-CM | POA: Diagnosis not present

## 2023-06-26 DIAGNOSIS — G4733 Obstructive sleep apnea (adult) (pediatric): Secondary | ICD-10-CM

## 2023-06-26 DIAGNOSIS — E7800 Pure hypercholesterolemia, unspecified: Secondary | ICD-10-CM | POA: Diagnosis not present

## 2023-06-26 DIAGNOSIS — E1169 Type 2 diabetes mellitus with other specified complication: Secondary | ICD-10-CM | POA: Diagnosis not present

## 2023-06-26 DIAGNOSIS — E669 Obesity, unspecified: Secondary | ICD-10-CM

## 2023-06-26 DIAGNOSIS — Z6836 Body mass index (BMI) 36.0-36.9, adult: Secondary | ICD-10-CM

## 2023-06-26 MED ORDER — TIRZEPATIDE 12.5 MG/0.5ML ~~LOC~~ SOAJ: 12.5000 mg | SUBCUTANEOUS | 1 refills | Status: DC

## 2023-06-26 NOTE — Progress Notes (Signed)
 Office: [phone]  /  Fax: [phone]  Weight Summary and Body Composition Analysis (BIA)  Vitals Temp: 98.2 F (36.8 C) BP: 101/60 Pulse Rate: 80 SpO2: 98 %   Anthropometric Measurements Height: 5' 7 (1.702 m) Weight: 230 lb (104.3 kg) BMI (Calculated): 36.01 Weight at Last Visit: 230 lb Weight Lost Since Last Visit: 0 lb Weight Gained Since Last Visit: 0 lb Starting Weight: 275 lb Total Weight Loss (lbs): 45 lb (20.4 kg) Peak Weight: 300 lb   Body Composition  Body Fat %: 36.8 % Fat Mass (lbs): 84.8 lbs Muscle Mass (lbs): 138 lbs Total Body Water (lbs): 91.6 lbs Visceral Fat Rating : 10    RMR: [id number]  Today's Visit #: 17  Starting Date: 03/02/22   Subjective   Chief Complaint: Obesity  Interval History Discussed the use of AI scribe software for clinical note transcription with the patient, who gave verbal consent to proceed.  History of Present Illness   [name] [name] is a 53 year old female with type two diabetes who presents for medical weight management.  She maintains a 1500 calorie meal plan approximately 85% of the time without tracking her calories. She believes she is consuming the recommended amount of protein and staying adequately hydrated. No skipped meals. She exercises seven days a week, up to 120 minutes, mostly cardio. Despite these efforts, she has not lost additional weight since her last visit.  She has been dealing with a persistent poison ivy rash for a month, requiring two rounds of steroids and a shot. The rash has not resolved, and she is preparing to start another round of steroids. The steroids have affected her blood sugar levels, causing significant fluctuations. She has experienced both low and high blood sugar readings, with a notable drop to 60 mg/dL last Monday, requiring glucose tablets to stabilize.  Her type two diabetes management includes the use of Mounjaro  at a dose of 12.5 mg. She monitors her blood sugar closely,  typically running around 96-97 mg/dL. However, recent steroid use has caused her blood sugar to become erratic, with significant drops and occasional highs.  She has a history of sleep apnea, managed with CPAP therapy.       Challenges affecting patient progress: orthopedic problems, medical conditions or chronic pain affecting mobility.    Pharmacotherapy for weight management: She is currently taking Monjauro with diabetes as the primary indication with adequate clinical response  and without side effects..   Assessment and Plan   Treatment Plan For Obesity:  Recommended Dietary Goals  Marixa is currently in the action stage of change. As such, her goal is to continue weight management plan. She has agreed to: continue current plan  Behavioral Health and Counseling  We discussed the following behavioral modification strategies today: continue to work on maintaining a reduced calorie state, getting the recommended amount of protein, incorporating whole foods, making healthy choices, staying well hydrated and practicing mindfulness when eating..  Additional education and resources provided today: None  Recommended Physical Activity Goals  Blaire has been advised to work up to 150 minutes of moderate intensity aerobic activity a week and strengthening exercises 2-3 times per week for cardiovascular health, weight loss maintenance and preservation of muscle mass.   She has agreed to :  Increase the intensity, frequency or duration of strengthening exercises   Medical Interventions and Pharmacotherapy  We discussed various medication options to help Almena with her weight loss efforts and we both agreed to : Adequate clinical  response to anti-obesity medication, continue current regimen  Associated Conditions Impacted by Obesity Treatment  OSA on CPAP Assessment & Plan: On CPAP with reported good compliance. Continue PAP therapy. Losing 15% or more of body weight may improve AHI.     - Continue CPAP therapy - Continue GLP-1 therapy to aid in weight management and diabetes control   Pure hypercholesterolemia Assessment & Plan: Her most recent LDL cholesterol from May is 67.  She is currently on rosuvastatin without any adverse effects - Continue rosuvastatin - Continue to reduce saturated fats in diet   Type 2 diabetes mellitus with other specified complication, unspecified whether long term insulin  use (HCC) Assessment & Plan: I was able to login to outside records but I do not see an A1c.  We will collect hemoglobin A1c at the next office visit.  She is currently on Mounjaro  and has been experiencing some episodes of hypoglycemia since she has been on steroids likely due to glycemic excursions followed by hyperinsulinemia.  Patient advised on the avoidance of reducing simple sugars in diet and including more whole grains to stabilize blood sugars.  She will continue to monitor.  We discussed management of hypoglycemia.  If symptoms do not resolve with diet and after stopping prednisone  that we may have to reduce GLP-1.   Type 2 diabetes mellitus with other specified complication, without long-term current use of insulin  [facility]) Assessment & Plan: I was able to login to outside records but I do not see an A1c.  We will collect hemoglobin A1c at the next office visit.  She is currently on Mounjaro  and has been experiencing some episodes of hypoglycemia since she has been on steroids likely due to glycemic excursions followed by hyperinsulinemia.  Patient advised on the avoidance of reducing simple sugars in diet and including more whole grains to stabilize blood sugars.  She will continue to monitor.  We discussed management of hypoglycemia.  If symptoms do not resolve with diet and after stopping prednisone  that we may have to reduce GLP-1.  Orders: -     Tirzepatide ; Inject 12.5 mg into the skin once a week.  Dispense: 2 mL; Refill: 1         Objective    Physical Exam:  Blood pressure 101/60, pulse 80, temperature 98.2 F (36.8 C), height 5' 7 (1.702 m), weight 230 lb (104.3 kg), SpO2 98%. Body mass index is 36.02 kg/m.  General: She is overweight, cooperative, alert, well developed, and in no acute distress. PSYCH: Has normal mood, affect and thought process.   HEENT: EOMI, sclerae are anicteric. Lungs: Normal breathing effort, no conversational dyspnea. Extremities: No edema.  Neurologic: No gross sensory or motor deficits. No tremors or fasciculations noted.    Diagnostic Data Reviewed:  BMET    Component Value Date/Time   NA 139 02/15/2023 0000   K 4.5 02/15/2023 0000   CL 105 02/15/2023 0000   CO2 25 (A) 02/15/2023 0000   GLUCOSE 90 03/02/2022 1034   GLUCOSE 101 (H) 07/10/2015 1552   BUN 9 03/02/2022 1034   CREATININE 0.8 02/15/2023 0000   CREATININE 0.74 03/02/2022 1034   CALCIUM 10.1 02/15/2023 0000   GFRNONAA >60 07/10/2015 1552   GFRAA >60 07/10/2015 1552   Lab Results  Component Value Date   HGBA1C 5.9 (H) 06/20/2022   HGBA1C 5.8 (H) 03/02/2022   Lab Results  Component Value Date   INSULIN  23.8 03/02/2022   Lab Results  Component Value Date   TSH 1.27  02/15/2023   CBC    Component Value Date/Time   WBC 5.9 02/15/2023 0000   WBC 6.8 07/10/2015 1552   RBC 4.83 02/15/2023 0000   HGB 13.3 03/02/2022 1034   HCT 41.9 03/02/2022 1034   PLT 360 03/02/2022 1034   MCV 85 03/02/2022 1034   MCH 27.1 03/02/2022 1034   MCH 26.6 07/10/2015 1552   MCHC 31.7 03/02/2022 1034   MCHC 32.4 07/10/2015 1552   RDW 13.3 03/02/2022 1034   Iron Studies No results found for: IRON, TIBC, FERRITIN, IRONPCTSAT Lipid Panel     Component Value Date/Time   CHOL 152 02/15/2023 0000   CHOL 139 03/02/2022 1034   TRIG 80 02/15/2023 0000   HDL 40 02/15/2023 0000   HDL 39 (L) 03/02/2022 1034   LDLCALC 96 02/15/2023 0000   LDLCALC 82 03/02/2022 1034   Hepatic Function Panel     Component Value Date/Time    PROT 7.4 03/02/2022 1034   ALBUMIN 4.7 03/02/2022 1034   AST 23 02/15/2023 0000   ALT 22 02/15/2023 0000   ALKPHOS 71 02/15/2023 0000   BILITOT 0.3 03/02/2022 1034      Component Value Date/Time   TSH 1.27 02/15/2023 0000   TSH 1.330 03/02/2022 1034   Nutritional Lab Results  Component Value Date   VD25OH 44.1 02/15/2023   VD25OH 31.4 06/20/2022   VD25OH 26.9 (L) 03/02/2022    Medications: Outpatient Encounter Medications as of 06/26/2023  Medication Sig   bisacodyl  (DULCOLAX) 5 MG EC tablet Take 1 tablet (5 mg total) by mouth daily as needed for moderate constipation.   Cholecalciferol (VITAMIN D3) 50 MCG (2000 UT) CAPS Take 1 capsule (2,000 Units total) by mouth daily.   Multiple Vitamins-Minerals (CENTRUM MINIS ADULTS 50+ PO) Take by mouth.   Multiple Vitamins-Minerals (OCUVITE ADULT 50+) CAPS Take by mouth.   NON FORMULARY Allergy shot twice weekly   omeprazole (PRILOSEC) 40 MG capsule Take 40 mg by mouth daily.   polyethylene glycol powder (GLYCOLAX /MIRALAX ) 17 GM/SCOOP powder Take 17 g by mouth 2 (two) times daily as needed.   rosuvastatin (CRESTOR) 20 MG tablet Take 20 mg by mouth daily.   tranexamic acid (LYSTEDA) 650 MG TABS tablet Take 650 mg by mouth as needed (takes 2 tablets 3 times a day prn).   [DISCONTINUED] tirzepatide  (MOUNJARO ) 12.5 MG/0.5ML Pen Inject 12.5 mg into the skin once a week.   tirzepatide  (MOUNJARO ) 12.5 MG/0.5ML Pen Inject 12.5 mg into the skin once a week.   No facility-administered encounter medications on file as of 06/26/2023.     Follow-Up   Return in about 4 weeks (around 07/24/2023).SABRA She was informed of the importance of frequent follow up visits to maximize her success with intensive lifestyle modifications for her multiple health conditions.  Attestation Statement   Reviewed by clinician on day of visit: allergies, medications, problem list, medical history, surgical history, family history, social history, and previous encounter  notes.     Lucas Parker, MD

## 2023-06-26 NOTE — Assessment & Plan Note (Signed)
 On CPAP with reported good compliance. Continue PAP therapy. Losing 15% or more of body weight may improve AHI.    - Continue CPAP therapy - Continue GLP-1 therapy to aid in weight management and diabetes control

## 2023-06-26 NOTE — Assessment & Plan Note (Signed)
 Her most recent LDL cholesterol from May is 29.  She is currently on rosuvastatin without any adverse effects - Continue rosuvastatin - Continue to reduce saturated fats in diet

## 2023-06-26 NOTE — Assessment & Plan Note (Signed)
 I was able to login to outside records but I do not see an A1c.  We will collect hemoglobin A1c at the next office visit.  She is currently on Mounjaro  and has been experiencing some episodes of hypoglycemia since she has been on steroids likely due to glycemic excursions followed by hyperinsulinemia.  Patient advised on the avoidance of reducing simple sugars in diet and including more whole grains to stabilize blood sugars.  She will continue to monitor.  We discussed management of hypoglycemia.  If symptoms do not resolve with diet and after stopping prednisone  that we may have to reduce GLP-1.

## 2023-07-24 ENCOUNTER — Ambulatory Visit (INDEPENDENT_AMBULATORY_CARE_PROVIDER_SITE_OTHER): Admitting: Internal Medicine

## 2023-08-02 ENCOUNTER — Ambulatory Visit: Admitting: Allergy and Immunology

## 2023-08-02 ENCOUNTER — Encounter: Payer: Self-pay | Admitting: Allergy and Immunology

## 2023-08-02 VITALS — BP 114/78 | HR 88 | Resp 20 | Ht 65.5 in | Wt 232.2 lb

## 2023-08-02 DIAGNOSIS — L302 Cutaneous autosensitization: Secondary | ICD-10-CM

## 2023-08-02 DIAGNOSIS — L237 Allergic contact dermatitis due to plants, except food: Secondary | ICD-10-CM | POA: Diagnosis not present

## 2023-08-02 MED ORDER — CETIRIZINE HCL 10 MG PO TABS
ORAL_TABLET | ORAL | 5 refills | Status: AC
Start: 2023-08-02 — End: ?

## 2023-08-02 MED ORDER — HALOBETASOL PROPIONATE 0.05 % EX CREA: TOPICAL_CREAM | CUTANEOUS | 5 refills | Status: AC
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
## 2023-08-02 NOTE — Patient Instructions (Addendum)
  1. Find and destroy source of Rhus plant family  2. With future steroid use, use high dose to eliminate, then slow taper over 1 month  3. Treat topically with Ultravate  cream - 1-2 times per day  4. Use cetirizine  10 mg - 1-2 tablets 1-2 times per day to raise itch threshold (MAX=4/day)  5. Influenza = tamiflu. Covid = paxlovid

## 2023-08-03 ENCOUNTER — Encounter: Payer: Self-pay | Admitting: Allergy and Immunology

## 2023-08-21 ENCOUNTER — Ambulatory Visit (INDEPENDENT_AMBULATORY_CARE_PROVIDER_SITE_OTHER): Admitting: Internal Medicine

## 2023-08-21 ENCOUNTER — Encounter (INDEPENDENT_AMBULATORY_CARE_PROVIDER_SITE_OTHER): Payer: Self-pay | Admitting: Internal Medicine

## 2023-08-21 VITALS — BP 97/70 | HR 87 | Temp 98.9°F | Ht 67.0 in | Wt 228.0 lb

## 2023-08-21 DIAGNOSIS — E669 Obesity, unspecified: Secondary | ICD-10-CM

## 2023-08-21 DIAGNOSIS — Z6835 Body mass index (BMI) 35.0-35.9, adult: Secondary | ICD-10-CM

## 2023-08-21 DIAGNOSIS — E1169 Type 2 diabetes mellitus with other specified complication: Secondary | ICD-10-CM

## 2023-08-21 DIAGNOSIS — G4733 Obstructive sleep apnea (adult) (pediatric): Secondary | ICD-10-CM

## 2023-08-21 DIAGNOSIS — E7800 Pure hypercholesterolemia, unspecified: Secondary | ICD-10-CM | POA: Diagnosis not present

## 2023-08-21 DIAGNOSIS — N951 Menopausal and female climacteric states: Secondary | ICD-10-CM

## 2023-08-21 DIAGNOSIS — Z7985 Long-term (current) use of injectable non-insulin antidiabetic drugs: Secondary | ICD-10-CM

## 2023-08-21 MED ORDER — TIRZEPATIDE 12.5 MG/0.5ML ~~LOC~~ SOAJ
12.5000 mg | SUBCUTANEOUS | 0 refills | Status: DC
Start: 2023-08-21 — End: 2023-10-02

## 2023-08-21 NOTE — Assessment & Plan Note (Signed)
 [name] [name] has lost a total of 19 pounds her weight loss rate has slowed, her treatment with GLP-1 has also been interrupted for 2 weeks.  She is also not getting an adequate amount of protein and physical activity levels have been impacted by her chronic lower back pain.  She is awaiting imaging test results to proceed with treatment.  She inquires about test.  She does not like meat much I would like for her to increase protein from other sources which were reviewed with her today.  This will help prevent adaptive thermogenesis associated with calorie restriction and weight loss.

## 2023-08-21 NOTE — Assessment & Plan Note (Signed)
 Her A1c is 5.0, indicating excellent blood sugar control. She experienced low blood sugar episodes during the summer, likely due to external factors such as steroid use. Her blood pressure is well controlled, and she is not on any antihypertensive medications. The decision to increase Mounjaro  is deferred until blood sugar stabilizes to avoid further hypoglycemic episodes. - Monitor blood sugar levels - Maintain current Mounjaro  dose until blood sugar stabilizes

## 2023-08-21 NOTE — Assessment & Plan Note (Signed)
 On CPAP with reported good compliance. Continue PAP therapy. Losing 15% or more of body weight may improve AHI.    - Continue CPAP therapy - Continue GLP-1 therapy to aid in weight management and diabetes control

## 2023-08-21 NOTE — Assessment & Plan Note (Signed)
 Her most recent LDL cholesterol from May is 29.  She is currently on rosuvastatin without any adverse effects - Continue rosuvastatin - Continue to reduce saturated fats in diet

## 2023-08-21 NOTE — Progress Notes (Signed)
 Office: [phone]  /  Fax: [phone]  Weight Summary And Body Composition Analysis (BIA)  Vitals Temp: 98.9 F (37.2 C) BP: 97/70 Pulse Rate: 87 SpO2: 97 %   Anthropometric Measurements Height: 5' 7 (1.702 m) Weight: 228 lb (103.4 kg) BMI (Calculated): 35.7 Weight at Last Visit: 230 lb Weight Lost Since Last Visit: 2 lb Weight Gained Since Last Visit: 0 lb Starting Weight: 275 lb Total Weight Loss (lbs): 47 lb (21.3 kg) Peak Weight: 300 lb   Body Composition  Body Fat %: 37.5 % Fat Mass (lbs): 85.6 lbs Muscle Mass (lbs): 135.2 lbs Total Body Water (lbs): 89.2 lbs Visceral Fat Rating : 10    RMR: [id number]  Today's Visit #: 17  Starting Date: 03/02/22   Subjective   Chief Complaint: Obesity  [name] [name] is here to discuss her progress with her obesity treatment plan. She is following the Category 3 plan - 1500 kcal per day and states she is following her eating plan approximately 70-80% of the time. She states she is exercising 60 minutes 6 times per week..  Weight Progress Since Last Visit:  Since last office visit she has lost 2 pounds. She reports good adherence to reduced calorie nutritional plan. She has been working on reading food labels, not skipping meals, increasing protein intake at every meal, drinking more water, making healthier choices, reducing portion sizes, and incorporating more whole foods   Nutritional 24 HR Recall: Intake consistent with prescribed nutritional plan  Challenges affecting patient progress: lack of strengthening exercises.   Orexigenic Control: Denies problems with appetite and hunger signals.  Denies problems with satiety and satiation.  Denies problems with eating patterns and portion control.  Denies abnormal cravings. Denies feeling deprived or restricted.   Pharmacotherapy for weight management: She is currently taking Monjauro with diabetes as the primary indication and obesity secondary with adequate clinical  response  and without side effects..   Assessment and Plan   Treatment Plan For Obesity:  Recommended Dietary Goals  [name] [name] is currently in the action stage of change. As such, her goal is to continue weight management plan. She has agreed to: continue current plan  Behavioral Health and Counseling  We discussed the following behavioral modification strategies today: continue to work on maintaining a reduced calorie state, getting the recommended amount of protein, incorporating whole foods, making healthy choices, staying well hydrated and practicing mindfulness when eating..  Additional education and resources provided today: None  Recommended Physical Activity Goals  [name] [name] has been advised to work up to 150 minutes of moderate intensity aerobic activity a week and strengthening exercises 2-3 times per week for cardiovascular health, weight loss maintenance and preservation of muscle mass.   She has agreed to :  Think about enjoyable ways to increase daily physical activity and overcoming barriers to exercise and Increase physical activity in their day and reduce sedentary time (increase NEAT).  Pharmacotherapy and Medical Interventions  Adequate clinical response to anti-obesity medication, continue current regimen, will not increase due to low blood sugar.   Associated Conditions Impacted by Obesity Treatment  Assessment & Plan OSA on CPAP On CPAP with reported good compliance. Continue PAP therapy. Losing 15% or more of body weight may improve AHI.    - Continue CPAP therapy - Continue GLP-1 therapy to aid in weight management and diabetes control Type 2 diabetes mellitus with other specified complication, unspecified whether long term insulin  use (HCC) Her A1c is 5.0, indicating excellent blood sugar control. She experienced  low blood sugar episodes during the summer, likely due to external factors such as steroid use. Her blood pressure is well controlled, and she is not on  any antihypertensive medications. The decision to increase Mounjaro  is deferred until blood sugar stabilizes to avoid further hypoglycemic episodes. - Monitor blood sugar levels - Maintain current Mounjaro  dose until blood sugar stabilizes Generalized obesity with starting BMI of 43 [name] has lost a total of 19 pounds her weight loss rate has slowed, her treatment with GLP-1 has also been interrupted for 2 weeks.  She is also not getting an adequate amount of protein and physical activity levels have been impacted by her chronic lower back pain.  She is awaiting imaging test results to proceed with treatment.  She inquires about test.  She does not like meat much I would like for her to increase protein from other sources which were reviewed with her today.  This will help prevent adaptive thermogenesis associated with calorie restriction and weight loss. Pure hypercholesterolemia Her most recent LDL cholesterol from May is 28.  She is currently on rosuvastatin without any adverse effects - Continue rosuvastatin - Continue to reduce saturated fats in diet   Menopausal state She is in a menopausal state, which may affect her weight management and muscle mass. - Encourage strength training exercises, including bands and light weights       Objective   Physical Exam:  Blood pressure 97/70, pulse 87, temperature 98.9 F (37.2 C), height 5' 7 (1.702 m), weight 228 lb (103.4 kg), last menstrual period 08/21/2023, SpO2 97%. Body mass index is 35.71 kg/m.  General: She is overweight, cooperative, alert, well developed, and in no acute distress. PSYCH: Has normal mood, affect and thought process.   HEENT: EOMI, sclerae are anicteric. Lungs: Normal breathing effort, no conversational dyspnea. Extremities: No edema.  Neurologic: No gross sensory or motor deficits. No tremors or fasciculations noted.    Diagnostic Data Reviewed:  BMET    Component Value Date/Time   NA 139 02/15/2023 0000    K 4.5 02/15/2023 0000   CL 105 02/15/2023 0000   CO2 25 (A) 02/15/2023 0000   GLUCOSE 90 03/02/2022 1034   GLUCOSE 101 (H) 07/10/2015 1552   BUN 9 03/02/2022 1034   CREATININE 0.8 02/15/2023 0000   CREATININE 0.74 03/02/2022 1034   CALCIUM 10.1 02/15/2023 0000   GFRNONAA >60 07/10/2015 1552   GFRAA >60 07/10/2015 1552   Lab Results  Component Value Date   HGBA1C 5.9 (H) 06/20/2022   HGBA1C 5.8 (H) 03/02/2022   Lab Results  Component Value Date   INSULIN  23.8 03/02/2022   Lab Results  Component Value Date   TSH 1.27 02/15/2023   CBC    Component Value Date/Time   WBC 5.9 02/15/2023 0000   WBC 6.8 07/10/2015 1552   RBC 4.83 02/15/2023 0000   HGB 13.3 03/02/2022 1034   HCT 41.9 03/02/2022 1034   PLT 360 03/02/2022 1034   MCV 85 03/02/2022 1034   MCH 27.1 03/02/2022 1034   MCH 26.6 07/10/2015 1552   MCHC 31.7 03/02/2022 1034   MCHC 32.4 07/10/2015 1552   RDW 13.3 03/02/2022 1034   Iron Studies No results found for: IRON, TIBC, FERRITIN, IRONPCTSAT Lipid Panel     Component Value Date/Time   CHOL 152 02/15/2023 0000   CHOL 139 03/02/2022 1034   TRIG 80 02/15/2023 0000   HDL 40 02/15/2023 0000   HDL 39 (L) 03/02/2022 1034   LDLCALC 96  02/15/2023 0000   LDLCALC 82 03/02/2022 1034   Hepatic Function Panel     Component Value Date/Time   PROT 7.4 03/02/2022 1034   ALBUMIN 4.7 03/02/2022 1034   AST 23 02/15/2023 0000   ALT 22 02/15/2023 0000   ALKPHOS 71 02/15/2023 0000   BILITOT 0.3 03/02/2022 1034      Component Value Date/Time   TSH 1.27 02/15/2023 0000   TSH 1.330 03/02/2022 1034   Nutritional Lab Results  Component Value Date   VD25OH 44.1 02/15/2023   VD25OH 31.4 06/20/2022   VD25OH 26.9 (L) 03/02/2022    Medications: Outpatient Encounter Medications as of 08/21/2023  Medication Sig   bisacodyl  (DULCOLAX) 5 MG EC tablet Take 1 tablet (5 mg total) by mouth daily as needed for moderate constipation.   cetirizine  (ZYRTEC ) 10 MG tablet  Take 1-2 tablets 1-2 times daily for itch control   Cholecalciferol (VITAMIN D3) 50 MCG (2000 UT) CAPS Take 1 capsule (2,000 Units total) by mouth daily.   halobetasol  (ULTRAVATE ) 0.05 % cream Apply to affected areas once or twice daily   Multiple Vitamins-Minerals (CENTRUM MINIS ADULTS 50+ PO) Take by mouth.   Multiple Vitamins-Minerals (OCUVITE ADULT 50+) CAPS Take by mouth.   NON FORMULARY Allergy shot twice weekly   omeprazole (PRILOSEC) 40 MG capsule Take 40 mg by mouth daily.   polyethylene glycol powder (GLYCOLAX /MIRALAX ) 17 GM/SCOOP powder Take 17 g by mouth 2 (two) times daily as needed.   rosuvastatin (CRESTOR) 20 MG tablet Take 20 mg by mouth daily.   [DISCONTINUED] tirzepatide  (MOUNJARO ) 12.5 MG/0.5ML Pen Inject 12.5 mg into the skin once a week.   tirzepatide  (MOUNJARO ) 12.5 MG/0.5ML Pen Inject 12.5 mg into the skin once a week.   No facility-administered encounter medications on file as of 08/21/2023.     Follow-Up   Return in about 6 years (around 08/20/2029) for For Weight Mangement with Dr. Francyne.SABRA She was informed of the importance of frequent follow up visits to maximize her success with intensive lifestyle modifications for her multiple health conditions.  Attestation Statement   Reviewed by clinician on day of visit: allergies, medications, problem list, medical history, surgical history, family history, social history, and previous encounter notes.     Lucas Francyne, MD

## 2023-09-14 NOTE — Progress Notes (Signed)
 SABRA
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
## 2023-09-18 ENCOUNTER — Encounter: Payer: Self-pay | Admitting: Family Medicine

## 2023-09-18 ENCOUNTER — Ambulatory Visit: Admitting: Family Medicine

## 2023-09-18 VITALS — BP 118/80 | HR 86 | Ht 67.0 in | Wt 229.5 lb

## 2023-09-18 DIAGNOSIS — G4733 Obstructive sleep apnea (adult) (pediatric): Secondary | ICD-10-CM

## 2023-09-18 NOTE — Patient Instructions (Signed)

## 2023-09-22 IMAGING — CT CT ABD-PELV W/ CM
2 of 5 series · 16 of 46 positions shown, 18 images · IV contrast (APPLIED)
Comparison: None.

CLINICAL DATA: Abdominal hernia, lower abdominal pain

EXAM:
CT ABDOMEN AND PELVIS WITH CONTRAST
TECHNIQUE: Multidetector CT imaging of the abdomen and pelvis was performed
using the standard protocol following bolus administration of
intravenous contrast.

[Series 2: abd pel w · axial · 0.96mm/px · z∈[-435,+50]mm · 13 of 111 slices shown, 15 images]
[im 7/111  soft-tissue]
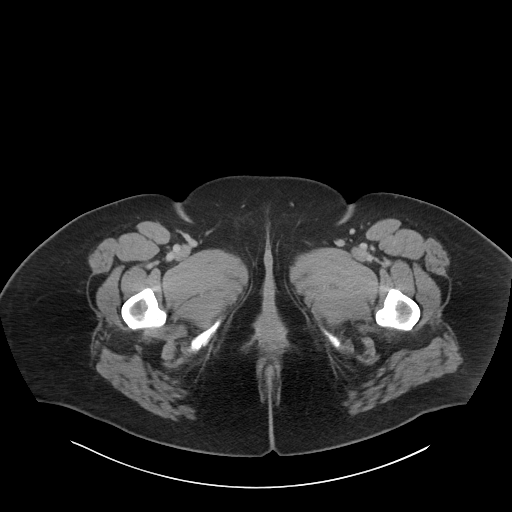
[im 7/111  bone]
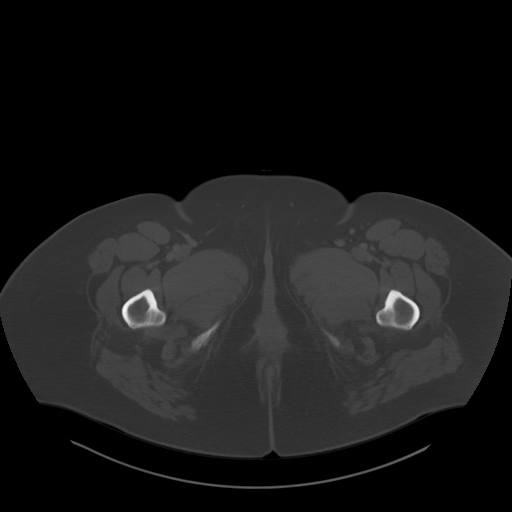
[im 13/111  soft-tissue]
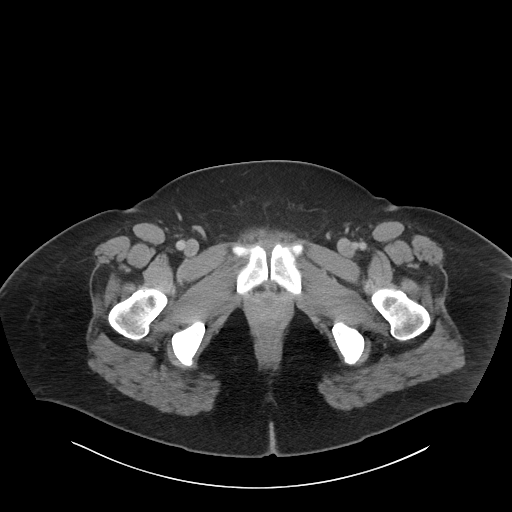
[im 25/111  soft-tissue]
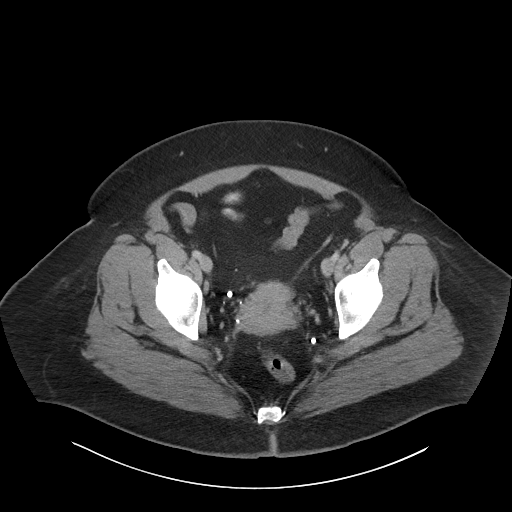
[im 31/111  soft-tissue]
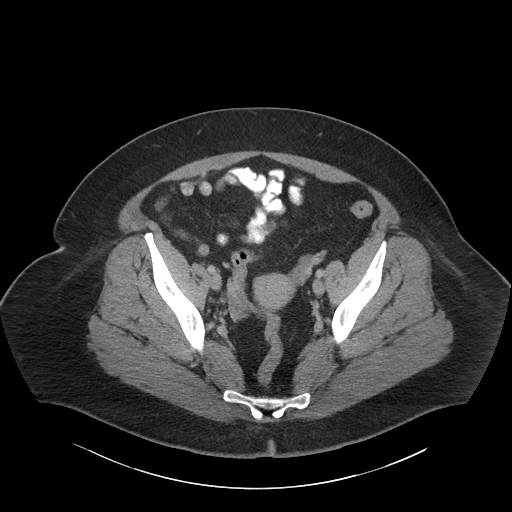
[im 37/111  soft-tissue]
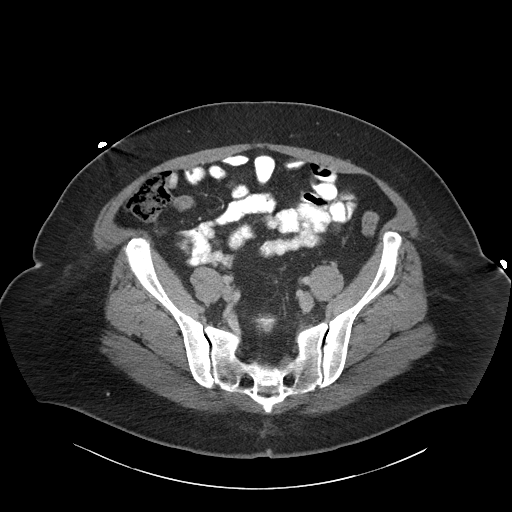
[im 49/111  soft-tissue]
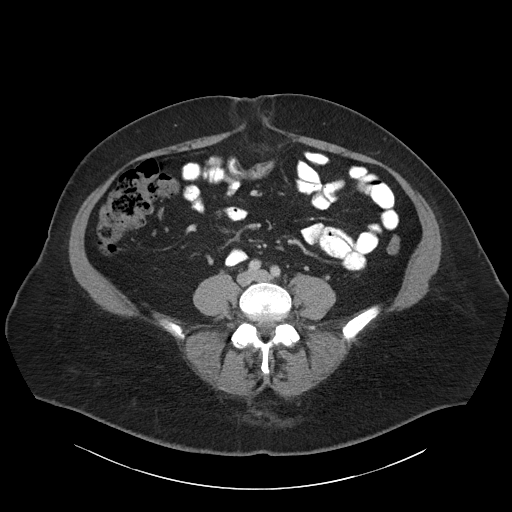
[im 56/111  soft-tissue]
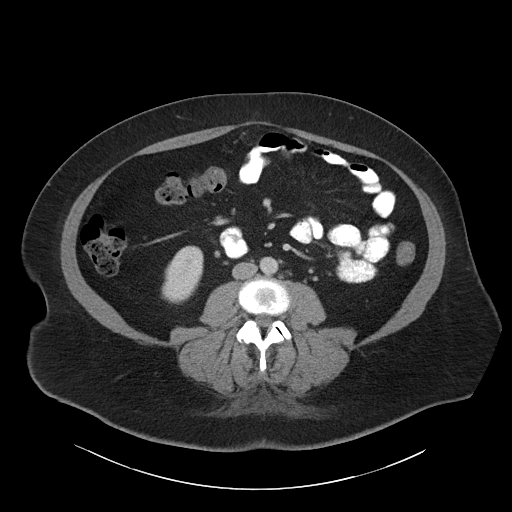
[im 62/111  soft-tissue]
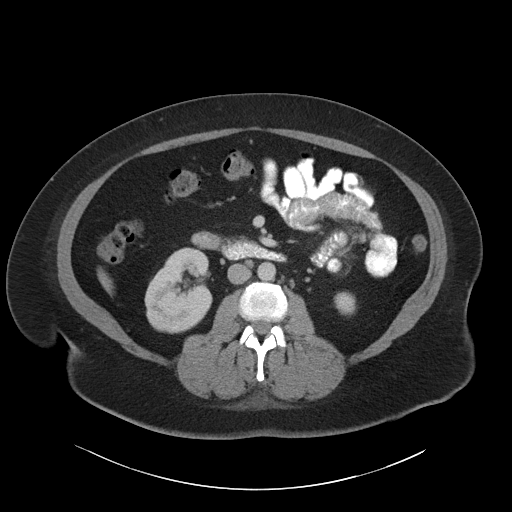
[im 74/111  soft-tissue]
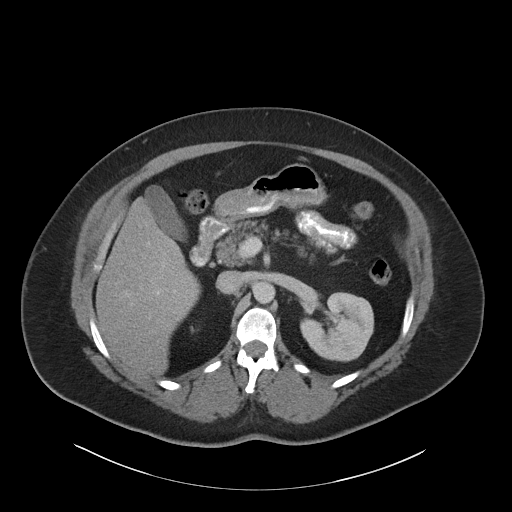
[im 74/111  bone]
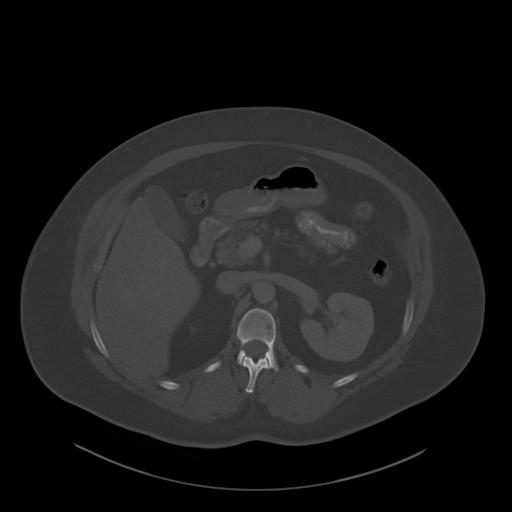
[im 80/111  soft-tissue]
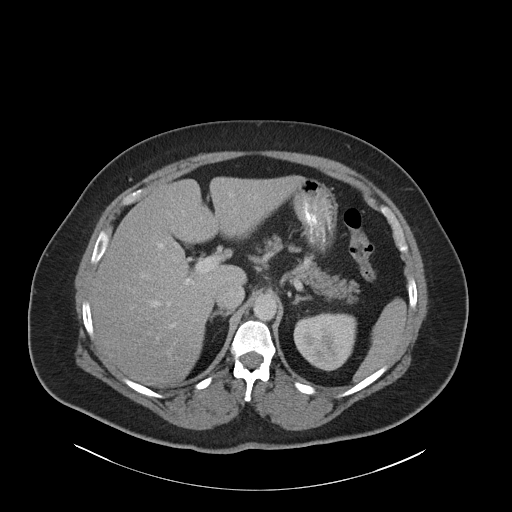
[im 86/111  soft-tissue]
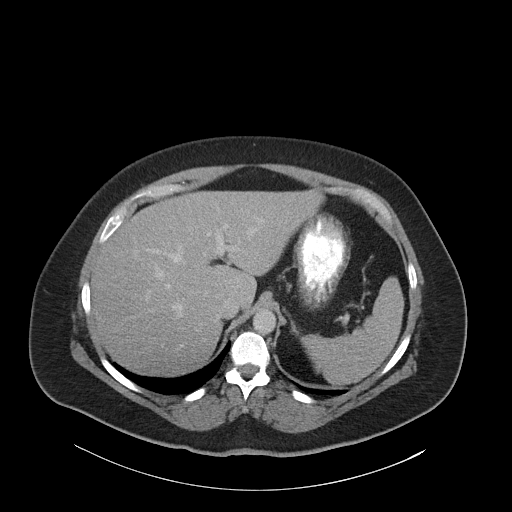
[im 98/111  soft-tissue]
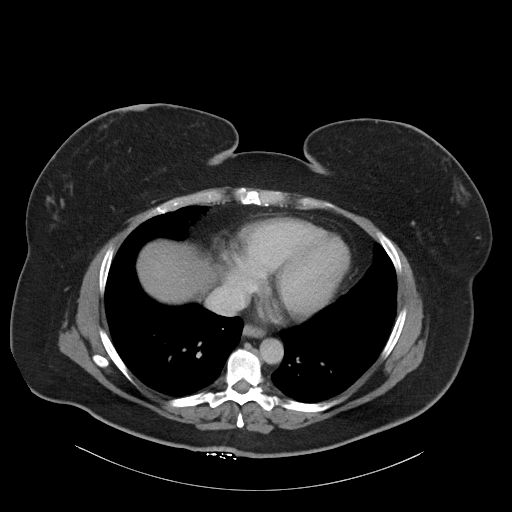
[im 104/111  soft-tissue]
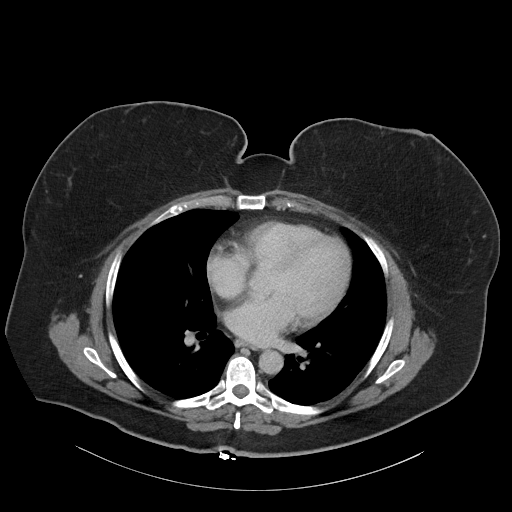

[Series 5: coronal · coronal · 0.97mm/px · 3 of 119 slices shown]
[im 40/119  soft-tissue]
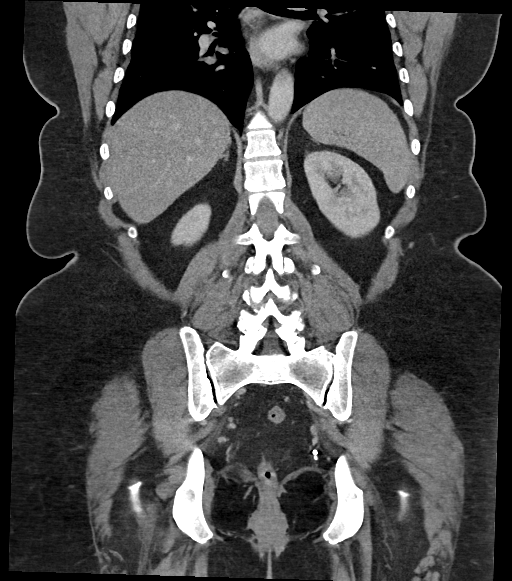
[im 53/119  soft-tissue]
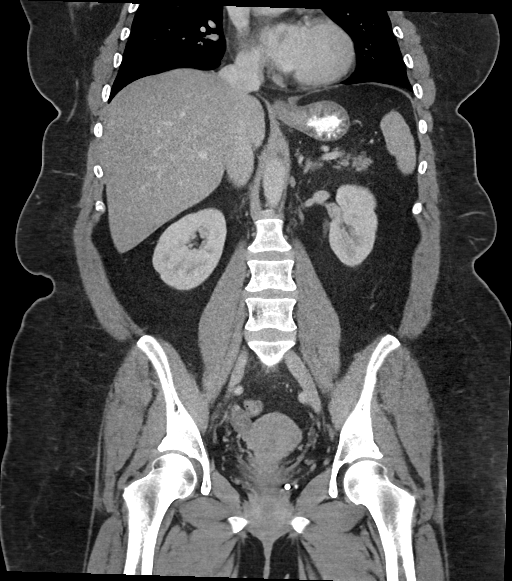
[im 66/119  soft-tissue]
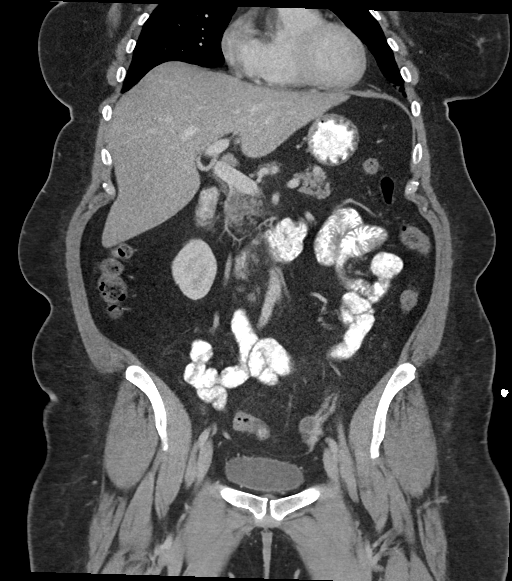

[16 of 46 positions shown; findings below may reference images not displayed]

RADIATION DOSE REDUCTION: This exam was performed according to the
departmental dose-optimization program which includes automated
exposure control, adjustment of the mA and/or kV according to
patient size and/or use of iterative reconstruction technique.

CONTRAST:  100mL OMNIPAQUE IOHEXOL 300 MG/ML  SOLN
FINDINGS: Lower chest: The lung bases are clear. Imaged heart is unremarkable.

Hepatobiliary: The liver and gallbladder are unremarkable. There is
no biliary ductal dilatation.

Pancreas: Unremarkable.

Spleen: Unremarkable.

Adrenals/Urinary Tract: The adrenals are unremarkable.

A subcentimeter hypodense lesion in the right kidney likely reflects
a cyst. The kidneys are otherwise unremarkable, with no other focal
lesion, stone, hydronephrosis, or hydroureter. The bladder is
unremarkable.

Stomach/Bowel: The stomach is unremarkable. There is no evidence of
bowel obstruction. There is no abnormal bowel wall thickening or
inflammatory change. There are a few colonic diverticula without
evidence of acute diverticulitis. The appendix is not definitively
identified, but there is no pericecal inflammatory change.

Vascular/Lymphatic: The abdominal aorta is normal in course and
caliber. The major branch vessels are patent. Main portal and
splenic veins are patent. There is no abdominal or pelvic
lymphadenopathy.

Reproductive: The uterus and adnexa are unremarkable.

Other: There is no ascites or free air. There is a fat containing
ventral abdominal hernia through a 1.5 cm defect in the anterior
abdominal wall musculature. There is no stranding within the fat to
suggest incarceration.

Musculoskeletal: There is no acute osseous abnormality or aggressive
osseous lesion.
IMPRESSION: 1. Fat containing ventral abdominal hernia through a 1.5 cm defect
in the anterior abdominal wall musculature. No stranding within the
fat to suggest incarceration.
2. No acute findings in the abdomen or pelvis.
3. A few scattered colonic diverticula without evidence of acute
diverticulitis.

## 2023-10-02 ENCOUNTER — Ambulatory Visit (INDEPENDENT_AMBULATORY_CARE_PROVIDER_SITE_OTHER): Admitting: Internal Medicine

## 2023-10-02 ENCOUNTER — Encounter (INDEPENDENT_AMBULATORY_CARE_PROVIDER_SITE_OTHER): Payer: Self-pay | Admitting: Internal Medicine

## 2023-10-02 VITALS — BP 97/63 | HR 93 | Temp 98.5°F | Ht 67.0 in | Wt 226.0 lb

## 2023-10-02 DIAGNOSIS — Z6836 Body mass index (BMI) 36.0-36.9, adult: Secondary | ICD-10-CM | POA: Diagnosis not present

## 2023-10-02 DIAGNOSIS — E7800 Pure hypercholesterolemia, unspecified: Secondary | ICD-10-CM | POA: Diagnosis not present

## 2023-10-02 DIAGNOSIS — E1169 Type 2 diabetes mellitus with other specified complication: Secondary | ICD-10-CM | POA: Diagnosis not present

## 2023-10-02 DIAGNOSIS — Z7984 Long term (current) use of oral hypoglycemic drugs: Secondary | ICD-10-CM

## 2023-10-02 DIAGNOSIS — Z8249 Family history of ischemic heart disease and other diseases of the circulatory system: Secondary | ICD-10-CM | POA: Insufficient documentation

## 2023-10-02 DIAGNOSIS — E66812 Obesity, class 2: Secondary | ICD-10-CM

## 2023-10-02 MED ORDER — TIRZEPATIDE 12.5 MG/0.5ML ~~LOC~~ SOAJ: 12.5000 mg | SUBCUTANEOUS | 0 refills | Status: DC

## 2023-10-02 NOTE — Assessment & Plan Note (Signed)
 Weight: decrease of 49 lb (17.8%) over 1 year, 7 months  Start: 03/02/2022 275 lb (124.7 kg)  End: 10/02/2023 226 lb (102.5 kg)

## 2023-10-02 NOTE — Assessment & Plan Note (Signed)
 Patient has strong family history of premature CAD she would like further restratification other than that offered through regular readings.  She is considered high risk due to the fact that she has diabetes and also high cholesterol.  She will like to see preventive cardiology as her brother was recently found to have advanced CAD at a young age.  She will be referred to Dr. Mona for further evaluation.

## 2023-10-02 NOTE — Assessment & Plan Note (Signed)
 Her A1c is 5.0, indicating excellent blood sugar control. Her blood pressure is well controlled, and she is not on any antihypertensive medications.  Currently on Mounjaro  without any adverse effects. - Monitor blood sugar levels - Continue Mounjaro  12.5 mg once a week

## 2023-10-02 NOTE — Assessment & Plan Note (Signed)
 Hyperlipidemia is managed with rosuvastatin 20 mg, which is well-tolerated. Cardiometabolic risk is reduced with optimal control of cholesterol, blood pressure, and glycemic levels. Family history of early CAD is noted, and preventive cardiology consultation is advised to assess any additional recommendations for cardiovascular risk management. - Continue rosuvastatin 20 mg daily. - Refer to preventive cardiology for a consultation with Dr. Mona to assess any additional recommendations for cardiovascular risk management.

## 2023-10-02 NOTE — Progress Notes (Signed)
 Office: [phone]  /  Fax: [phone]  Weight Summary and Body Composition Analysis (BIA)  Vitals Temp: 98.5 F (36.9 C) BP: 97/63 Pulse Rate: 93 SpO2: 98 %   Anthropometric Measurements Height: 5' 7 (1.702 m) Weight: 226 lb (102.5 kg) BMI (Calculated): 35.39 Weight at Last Visit: 228 lb Weight Lost Since Last Visit: 2 lb Weight Gained Since Last Visit: 0 lb Starting Weight: 49 lb Total Weight Loss (lbs): 49 lb (22.2 kg) Peak Weight: 300 lb   Body Composition  Body Fat %: 44.9 % Fat Mass (lbs): 101.4 lbs Muscle Mass (lbs): 118.4 lbs Total Body Water (lbs): 84.8 lbs Visceral Fat Rating : 12    RMR: [id number]  Today's Visit #: 18  Starting Date: 03/02/22   Subjective   Chief Complaint: Obesity  Interval History Discussed the use of AI scribe software for clinical note transcription with the patient, who gave verbal consent to proceed.  History of Present Illness [name] [name] is a 53 year old female who presents for medical weight management.  She has lost two pounds since her last visit and is adhering to a 1500 calorie diet. Her exercise routine includes 60 minutes of cardio five days a week.  She finds obtaining a three-month supply of her medication more convenient and cost-effective. She is currently taking rosuvastatin 20 mg for cholesterol management. Her blood pressure is typically 97/63 mmHg. Recent blood work from February showed an A1c of 5.0 and a normal TSH level.  She has been craving boiled eggs and tuna, consuming them regularly, and is mindful of her protein intake, aiming for at least 90 grams per day.  She has a family history of cardiovascular disease. Her father died from a massive heart attack at the age of 28, and her brother has significant cardiovascular issues despite being younger and thinner. She is concerned about hereditary factors and is actively managing her health to mitigate these risks.     Challenges affecting patient  progress: lack of strengthening exercise, medical comorbidities, and menopause.    Pharmacotherapy for weight management: She is currently taking Monjauro with diabetes as the primary indication and obesity secondary with adequate clinical response  and without side effects..   Assessment and Plan   Treatment Plan For Obesity:  Recommended Dietary Goals  [name] is currently in the action stage of change. As such, her goal is to continue weight management plan. She has agreed to: continue current plan  Behavioral Health and Counseling  We discussed the following behavioral modification strategies today: continue to work on maintaining a reduced calorie state, getting the recommended amount of protein, incorporating whole foods, making healthy choices, staying well hydrated and practicing mindfulness when eating. and increase protein intake, fibrous foods (25 grams per day for women, 30 grams for men) and water to improve satiety and decrease hunger signals. .  Additional education and resources provided today: None  Recommended Physical Activity Goals  [name] has been advised to work up to 150 minutes of moderate intensity aerobic activity a week and strengthening exercises 2-3 times per week for cardiovascular health, weight loss maintenance and preservation of muscle mass.  She has agreed to :  Increase volume of physical activity to a goal of 240 minutes a week and Combine aerobic and strengthening exercises for efficiency and improved cardiometabolic health.  Medical Interventions and Pharmacotherapy  We discussed various medication options to help [name] with her weight loss efforts and we both agreed to : Adequate clinical response to  anti-obesity medication, continue current regimen  Associated Conditions Impacted by Obesity Treatment  Assessment & Plan Type 2 diabetes mellitus with other specified complication, without long-term current use of insulin  (HCC) Her A1c is 5.0,  indicating excellent blood sugar control. Her blood pressure is well controlled, and she is not on any antihypertensive medications.  Currently on Mounjaro  without any adverse effects. - Monitor blood sugar levels - Continue Mounjaro  12.5 mg once a week Family history of premature CAD Patient has strong family history of premature CAD she would like further restratification other than that offered through regular readings.  She is considered high risk due to the fact that she has diabetes and also high cholesterol.  She will like to see preventive cardiology as her brother was recently found to have advanced CAD at a young age.  She will be referred to Dr. Mona for further evaluation. Pure hypercholesterolemia Hyperlipidemia is managed with rosuvastatin 20 mg, which is well-tolerated. Cardiometabolic risk is reduced with optimal control of cholesterol, blood pressure, and glycemic levels. Family history of early CAD is noted, and preventive cardiology consultation is advised to assess any additional recommendations for cardiovascular risk management. - Continue rosuvastatin 20 mg daily. - Refer to preventive cardiology for a consultation with Dr. Mona to assess any additional recommendations for cardiovascular risk management. Class 2 severe obesity with serious comorbidity and body mass index (BMI) of 36.0 to 36.9 in adult, unspecified obesity type Weight: decrease of 49 lb (17.8%) over 1 year, 7 months  Start: 03/02/2022 275 lb (124.7 kg)  End: 10/02/2023 226 lb (102.5 kg)    Obesity management is ongoing with a current weight of 226 pounds, reflecting a total weight loss of 49 pounds over a year and seven months. She is following a 1500 calorie diet and exercises five days a week, primarily cardio. Muscle mass has fluctuated due to previous steroid use, but current muscle mass is at 118 pounds. Body fat percentage is expected to be in the 40s, with a goal to reduce it further. The basal metabolic  rate is approximately 1700 calories, and a further calorie deficit is needed to continue weight loss. HIIT is recommended to efficiently combine cardio and strength training, which can help modulate inflammation, improve insulin  sensitivity, and promote the browning of fat. - Introduce high-intensity interval training (HIIT) once or twice a week to combine cardio and strength training. - Provide a 1200 calorie meal plan to increase calorie deficit. - Encourage protein intake of at least 90 grams per day. - Adjust calorie intake to 1200 calories, with an option for a 200 calorie protein snack if hungry.        Objective   Physical Exam:  Blood pressure 97/63, pulse 93, temperature 98.5 F (36.9 C), height 5' 7 (1.702 m), weight 226 lb (102.5 kg), last menstrual period 06/06/2023, SpO2 98%. Body mass index is 35.4 kg/m.  General: She is overweight, cooperative, alert, well developed, and in no acute distress. PSYCH: Has normal mood, affect and thought process.   HEENT: EOMI, sclerae are anicteric. Lungs: Normal breathing effort, no conversational dyspnea. Extremities: No edema.  Neurologic: No gross sensory or motor deficits. No tremors or fasciculations noted.    Diagnostic Data Reviewed:  BMET    Component Value Date/Time   NA 139 02/15/2023 0000   K 4.5 02/15/2023 0000   CL 105 02/15/2023 0000   CO2 25 (A) 02/15/2023 0000   GLUCOSE 90 03/02/2022 1034   GLUCOSE 101 (H) 07/10/2015 1552  BUN 9 03/02/2022 1034   CREATININE 0.8 02/15/2023 0000   CREATININE 0.74 03/02/2022 1034   CALCIUM 10.1 02/15/2023 0000   GFRNONAA >60 07/10/2015 1552   GFRAA >60 07/10/2015 1552   Lab Results  Component Value Date   HGBA1C 5.9 (H) 06/20/2022   HGBA1C 5.8 (H) 03/02/2022   Lab Results  Component Value Date   INSULIN  23.8 03/02/2022   Lab Results  Component Value Date   TSH 1.27 02/15/2023   CBC    Component Value Date/Time   WBC 5.9 02/15/2023 0000   WBC 6.8 07/10/2015  1552   RBC 4.83 02/15/2023 0000   HGB 13.3 03/02/2022 1034   HCT 41.9 03/02/2022 1034   PLT 360 03/02/2022 1034   MCV 85 03/02/2022 1034   MCH 27.1 03/02/2022 1034   MCH 26.6 07/10/2015 1552   MCHC 31.7 03/02/2022 1034   MCHC 32.4 07/10/2015 1552   RDW 13.3 03/02/2022 1034   Iron Studies No results found for: IRON, TIBC, FERRITIN, IRONPCTSAT Lipid Panel     Component Value Date/Time   CHOL 152 02/15/2023 0000   CHOL 139 03/02/2022 1034   TRIG 80 02/15/2023 0000   HDL 40 02/15/2023 0000   HDL 39 (L) 03/02/2022 1034   LDLCALC 96 02/15/2023 0000   LDLCALC 82 03/02/2022 1034   Hepatic Function Panel     Component Value Date/Time   PROT 7.4 03/02/2022 1034   ALBUMIN 4.7 03/02/2022 1034   AST 23 02/15/2023 0000   ALT 22 02/15/2023 0000   ALKPHOS 71 02/15/2023 0000   BILITOT 0.3 03/02/2022 1034      Component Value Date/Time   TSH 1.27 02/15/2023 0000   TSH 1.330 03/02/2022 1034   Nutritional Lab Results  Component Value Date   VD25OH 44.1 02/15/2023   VD25OH 31.4 06/20/2022   VD25OH 26.9 (L) 03/02/2022    Medications: Outpatient Encounter Medications as of 10/02/2023  Medication Sig   bisacodyl  (DULCOLAX) 5 MG EC tablet Take 1 tablet (5 mg total) by mouth daily as needed for moderate constipation.   cetirizine  (ZYRTEC ) 10 MG tablet Take 1-2 tablets 1-2 times daily for itch control   Cholecalciferol (VITAMIN D3) 50 MCG (2000 UT) CAPS Take 1 capsule (2,000 Units total) by mouth daily.   halobetasol  (ULTRAVATE ) 0.05 % cream Apply to affected areas once or twice daily   Multiple Vitamins-Minerals (CENTRUM MINIS ADULTS 50+ PO) Take by mouth.   Multiple Vitamins-Minerals (OCUVITE ADULT 50+) CAPS Take by mouth.   NON FORMULARY Allergy shot twice weekly (Patient not taking: Reported on 09/18/2023)   omeprazole (PRILOSEC) 40 MG capsule Take 40 mg by mouth daily.   polyethylene glycol powder (GLYCOLAX /MIRALAX ) 17 GM/SCOOP powder Take 17 g by mouth 2 (two) times daily  as needed.   rosuvastatin (CRESTOR) 20 MG tablet Take 20 mg by mouth daily.   tirzepatide  (MOUNJARO ) 12.5 MG/0.5ML Pen Inject 12.5 mg into the skin once a week.   [DISCONTINUED] tirzepatide  (MOUNJARO ) 12.5 MG/0.5ML Pen Inject 12.5 mg into the skin once a week.   No facility-administered encounter medications on file as of 10/02/2023.     Follow-Up   Return in about 6 weeks (around 11/13/2023) for For Weight Mangement with Dr. [email or web address] She was informed of the importance of frequent follow up visits to maximize her success with intensive lifestyle modifications for her multiple health conditions.  Attestation Statement   Reviewed by clinician on day of visit: allergies, medications, problem list, medical history, surgical history, family history, social  history, and previous encounter notes.     Lucas Parker, MD

## 2023-10-17 ENCOUNTER — Ambulatory Visit: Admitting: Podiatry

## 2023-10-17 DIAGNOSIS — E1169 Type 2 diabetes mellitus with other specified complication: Secondary | ICD-10-CM | POA: Diagnosis not present

## 2023-10-17 DIAGNOSIS — L600 Ingrowing nail: Secondary | ICD-10-CM | POA: Diagnosis not present

## 2023-10-17 DIAGNOSIS — R269 Unspecified abnormalities of gait and mobility: Secondary | ICD-10-CM | POA: Diagnosis not present
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
## 2023-10-17 NOTE — Patient Instructions (Addendum)
 Place 1/4 cup of epsom salts in a quart of warm tap water.  Submerge your foot or feet in the solution and soak for 20 minutes.  This soak should be done twice a day.  Next, remove your foot or feet from solution, blot dry the affected area. Apply ointment and cover if instructed by your doctor.   IF YOUR SKIN BECOMES IRRITATED WHILE USING THESE INSTRUCTIONS, IT IS OKAY TO SWITCH TO  WHITE VINEGAR AND WATER.  As another alternative soak, you may use antibacterial soap and water.  Monitor for any signs/symptoms of infection. Call the office immediately if any occur or go directly to the emergency room. Call with any questions/concerns.  Shoes: Good shoe brands in general I recommend include Brooks, Hoka's, Altra's, new balance.  Different models of Burnetta may work better than your current pair for you.  I do recommend going to a shoe store where he can get fitted for your shoes appropriately to find something with adequate width and provide some accommodation for the areas on the ball of your foot we get some callus.  Because of this, you may do best with a pair of over-the-counter extra-depth diabetic shoes such as Dr. Minor or SAS shoes.  You may want to try [employer] in [place] or [place] here in [place]

## 2023-10-18 ENCOUNTER — Encounter: Payer: Self-pay | Admitting: Podiatry

## 2023-10-31 ENCOUNTER — Ambulatory Visit: Admitting: Podiatry

## 2023-10-31 ENCOUNTER — Ambulatory Visit (INDEPENDENT_AMBULATORY_CARE_PROVIDER_SITE_OTHER)

## 2023-10-31 DIAGNOSIS — M216X9 Other acquired deformities of unspecified foot: Secondary | ICD-10-CM | POA: Diagnosis not present

## 2023-10-31 DIAGNOSIS — M21372 Foot drop, left foot: Secondary | ICD-10-CM

## 2023-10-31 DIAGNOSIS — R269 Unspecified abnormalities of gait and mobility: Secondary | ICD-10-CM

## 2023-10-31 DIAGNOSIS — L600 Ingrowing nail: Secondary | ICD-10-CM | POA: Diagnosis not present

## 2023-10-31 NOTE — Progress Notes (Signed)
 Subjective:  Patient ID: [name] [name], female    DOB: 1970-07-12,  MRN: [id number]  Chief Complaint  Patient presents with   Nail Problem    Bilateral Great nail borders removed 10/17/23.  Some redness, swelling and drainage. Has been soaking and using antibiotic ointment.  Not diabetic no anti coag Took foot and ankle x-rays to discuss gait.      Discussed the use of AI scribe software for clinical note transcription with the patient, who gave verbal consent to proceed.  History of Present Illness [name] [name] is a 53 year old female with diabetes who presents for follow-up of her foot and ankle issues.  She experiences ongoing light pink drainage from the site of her recent ingrown toenail removal on the big toe. She has been soaking the area but avoids using ointment to prevent excessive moisture.  She has foot and ankle weakness on the left side, attributed to her postoperative course and lack of rehabilitation. She has not been allowed to bear weight on the left side, leading to increased pain on the outer ball of her right foot due to compensatory weight shifting. Calluses have developed on both sides of the right foot.  She has decreased ankle joint range of motion on the left side, with difficulty clearing the ground when swinging her left foot forward, resulting in frequent falls. She experiences numbness and tingling in her feet, particularly after prolonged walking or sitting.  She has been exploring options for supportive footwear, including brands like Brooks and OrthoFeet, to accommodate her wide forefoot and high arches. She has been advised to wear supportive shoes, as going barefoot or wearing sandals increases her instability. She has experienced issues with previous braces that restricted her range of motion.      Objective:    Physical Exam MUSCULOSKELETAL: Increased forefoot abduction bilaterally. Shortened stride length on left side with compensatory increased  stride length on right side. High arches maintained through weight bearing. Left sided pelvic tilt. Shoulders tilt down towards left side during gait. Left knee slightly externally rotated. Left leg approximately 1-1.5 cm longer than right. 4/5 dorsiflexion and plantarflexion strength on left ankle. 4/5 inversion and eversion strength on left ankle. Decreased range of motion in left ankle joint. NEUROLOGICAL: Protective sensation intact on left side. No significant neuropathy observed. SKIN: No signs of infection.   No images are attached to the encounter.    Results RADIOLOGY Left Ankle X-ray: Evidence of an old fracture around the fibula and medial malleolus, slight narrowing of the ankle joint space, minimal spur formation on the tibia, good alignment. Right Ankle X-ray: Similar structure to the left, no acute findings, no issues with bone mineral density, good appearance of the ankle joint.   Assessment:   1. Altered gait   2. Cavus foot, acquired   3. Foot drop, left   4. Ingrown toenail of both feet      Plan:  Patient was evaluated and treated and all questions answered.  Assessment and Plan Assessment & Plan Bilateral ingrown toenail post-removal healing Post-removal healing with mild drainage, no infection. Right side nearly healed. - Continue soaking and air drying. - Keep toes covered with a bandage. - Avoid ointment to prevent moisture. - Apply Band-Aids to both sides of the toes.  Abnormal gait and mobility secondary to remote lower extremity injuries Altered gait due to previous injuries with forefoot abduction, stride length discrepancy, pelvic and shoulder tilt, and knee rotation. Left leg longer by a centimeter. -  Consider physical therapy for gait and muscle strength. - Discuss brace options for ankle stability.  Bilateral foot pain and calluses associated with high arches (pes cavus) Foot pain and calluses from high arches causing pressure under fifth  metatarsal head and big toe joint. - Recommend supportive sneakers such as Burnetta, Asics, New Balance. - Consider diabetic shoes for extra depth and padding. - Suggest visiting a physical shoe store for fitting and width assessment. - Consider orthotics for support and padding.  Left ankle weakness and decreased range of motion Weakness and decreased range of motion in left ankle. Previous brace reduced range of motion. - Discuss brace options, including leaf spring AFO and hinged brace. - Consider physical therapy for strength and balance. - Evaluate insurance coverage for brace options.  High arch (pes cavus) foot type, bilateral Bilateral high arches contributing to ankle instability and hind foot arthritis risk, increasing pressure on metatarsal heads and heel. - Recommend supportive shoes for high arches. - Consider orthotics for additional arch support.  Possible mild bunion (hallux valgus) deformity, right foot Possible mild bunion with increased angle between first and second metatarsals, wider forefoot. - Consider orthotics to slow bunion progression. - Recommend shoes with appropriate width and padding.  Type 2 diabetes mellitus without neuropathy Type 2 diabetes without neuropathy. Protective sensation and vibration sense intact. - Consider diabetic shoes for deformity aspect. - Monitor foot health and callus management.      Return in about 4 weeks (around 11/28/2023) for gait abnormality.   Right sub 1st and sub 5th and left sub 1st callus and nails as courtesy  Maybe NCV for the drop foot? Vs pt?

## 2023-11-02 ENCOUNTER — Encounter: Payer: Self-pay | Admitting: Podiatry

## 2023-11-13 ENCOUNTER — Ambulatory Visit (INDEPENDENT_AMBULATORY_CARE_PROVIDER_SITE_OTHER): Payer: Self-pay | Admitting: Internal Medicine

## 2023-11-13 ENCOUNTER — Encounter (INDEPENDENT_AMBULATORY_CARE_PROVIDER_SITE_OTHER): Payer: Self-pay | Admitting: Internal Medicine

## 2023-11-13 VITALS — BP 106/67 | HR 81 | Ht 67.0 in | Wt 226.0 lb

## 2023-11-13 DIAGNOSIS — G4733 Obstructive sleep apnea (adult) (pediatric): Secondary | ICD-10-CM

## 2023-11-13 DIAGNOSIS — E1169 Type 2 diabetes mellitus with other specified complication: Secondary | ICD-10-CM | POA: Diagnosis not present

## 2023-11-13 DIAGNOSIS — E66812 Obesity, class 2: Secondary | ICD-10-CM | POA: Diagnosis not present

## 2023-11-13 DIAGNOSIS — E7800 Pure hypercholesterolemia, unspecified: Secondary | ICD-10-CM

## 2023-11-13 DIAGNOSIS — Z8249 Family history of ischemic heart disease and other diseases of the circulatory system: Secondary | ICD-10-CM

## 2023-11-13 DIAGNOSIS — Z7985 Long-term (current) use of injectable non-insulin antidiabetic drugs: Secondary | ICD-10-CM

## 2023-11-13 DIAGNOSIS — Z6836 Body mass index (BMI) 36.0-36.9, adult: Secondary | ICD-10-CM

## 2023-11-13 NOTE — Assessment & Plan Note (Signed)
 Her A1c is 5.0, indicating excellent blood sugar control. Her blood pressure is well controlled, and she is not on any antihypertensive medications.  Currently on Mounjaro  without any adverse effects.  Continue current weight management strategy

## 2023-11-13 NOTE — Assessment & Plan Note (Signed)
 Hyperlipidemia is managed with rosuvastatin 20 mg, which is well-tolerated. Cardiometabolic risk is reduced with optimal control of cholesterol, blood pressure, and glycemic levels. Family history of early CAD is noted, and preventive cardiology consultation is advised to assess any additional recommendations for cardiovascular risk management. - Continue rosuvastatin 20 mg daily. - Has an appointment with preventive cardiology due to family history of premature CAD

## 2023-11-13 NOTE — Assessment & Plan Note (Signed)
 On CPAP with reported good compliance. Continue PAP therapy.  She has lost 17% of total body weight.  Continue current weight management strategy

## 2023-11-13 NOTE — Assessment & Plan Note (Signed)
  Weight: decrease of 49 lb (17.8%) over 1 year, 8 months  Start: 03/16/2022 275 lb (124.7 kg)  End: 11/13/2023 226 lb (102.5 kg)  Obesity management is ongoing with a current weight of 226 pounds, reflecting a total weight loss of 49 pounds over a year and seven months. She is following a 1500 calorie diet and exercises five days a week, primarily cardio. Muscle mass has fluctuated due to previous steroid use, but current muscle mass is at 118 pounds. Body fat percentage is expected to be in the 40s, with a goal to reduce it further. The basal metabolic rate is approximately 1700 calories, and a further calorie deficit is needed to continue weight loss. HIIT is recommended to efficiently combine cardio and strength training, which can help modulate inflammation, improve insulin  sensitivity, and promote the browning of fat. - Introduce high-intensity interval training (HIIT) once or twice a week to combine cardio and strength training. - Provide a 1200 calorie meal plan to increase calorie deficit. - Encourage protein intake of at least 90 grams per day. - Adjust calorie intake to 1200 calories, with an option for a 200 calorie protein snack if hungry.

## 2023-11-13 NOTE — Progress Notes (Signed)
 Office: [phone]  /  Fax: [phone]  Weight Summary and Body Composition Analysis (BIA)  Vitals BP: 106/67 Pulse Rate: 81 SpO2: 97 %   Anthropometric Measurements Height: 5' 7 (1.702 m) Weight: 226 lb (102.5 kg) BMI (Calculated): 35.39 Weight at Last Visit: 226 lb Weight Lost Since Last Visit: 0 lb Weight Gained Since Last Visit: 0 lb Starting Weight: 275 lb Total Weight Loss (lbs): 49 lb (22.2 kg) Peak Weight: 300 lb   Body Composition  Body Fat %: 45.4 % Fat Mass (lbs): 103 lbs Muscle Mass (lbs): 117.66 lbs Total Body Water (lbs): 87.4 lbs Visceral Fat Rating : 12    RMR: [id number]  Today's Visit #: 19  Starting Date: 03/02/22   Subjective   Chief Complaint: Obesity  Interval History Discussed the use of AI scribe software for clinical note transcription with the patient, who gave verbal consent to proceed.  History of Present Illness    [name] [name] is a 53 year old female with type 2 diabetes, sleep apnea, PCOS, and hypercholesterolemia who presents for medical weight management.  Since last office visit she has maintained she reports following a 1500-calorie target but should be more around 1200.  She has not been exercising.  She has been on Mounjaro  12.5 mg weekly since September of last year for diabetes and weight management. She follows a 1500 calorie diet about 80% of the time, tracks her intake, gets adequate sleep, and consumes the recommended amount of protein. She has not been exercising due to foot pain and issues with her shoes. She is considering diabetic shoes to alleviate the pain, which has hindered her physical activity.  She has a history of type 2 diabetes. She experiences hypoglycemic episodes, particularly in the morning before eating, with blood glucose levels dropping to the 60s. She takes Crestor 20 mg daily for hypercholesterolemia and reports no history of hypertension. She is scheduled to see a cardiologist later this month  due to a family history of heart disease, as her brother and father had significant blockages at a young age.  She has lost 50 pounds over the past year and eight months, but her weight loss has slowed since May. She attributes this to a period of steroid use from May to August, which affected her sleep and weight loss. She has not been skipping meals but notes that her portions are smaller, and she has been craving sugar recently. She has been drinking spearmint and ginger tea to help with these cravings.     Challenges affecting patient progress: low volume of physical activity at present , orthopedic problems, medical conditions or chronic pain affecting mobility, medical comorbidities, and menopause.    Pharmacotherapy for weight management: She is currently taking Monjauro with diabetes as the primary indication and obesity secondary with adequate clinical response  and without side effects..   Assessment and Plan   Treatment Plan For Obesity:  Recommended Dietary Goals  [name] [name] is currently in the action stage of change. As such, her goal is to continue weight management plan. She has agreed to: follow the Category 2 plan - 1200 kcal per day  Behavioral Health and Counseling  We discussed the following behavioral modification strategies today: continue to work on maintaining a reduced calorie state, getting the recommended amount of protein, incorporating whole foods, making healthy choices, staying well hydrated and practicing mindfulness when eating. and increase protein intake, fibrous foods (25 grams per day for women, 30 grams for men) and water to  improve satiety and decrease hunger signals. .  Additional education and resources provided today: Handout on traveling and holiday eating strategies  Recommended Physical Activity Goals  [name] [name] has been advised to work up to 150 minutes of moderate intensity aerobic activity a week and strengthening exercises 2-3 times per week for  cardiovascular health, weight loss maintenance and preservation of muscle mass.  She has agreed to :  Think about enjoyable ways to increase daily physical activity and overcoming barriers to exercise  Medical Interventions and Pharmacotherapy  We discussed various medication options to help [name] [name] with her weight loss efforts and we both agreed to : Adequate clinical response to anti-obesity medication, continue current regimen and Patient was counseled on the importance of maintaining healthy lifestyle habits, including balanced nutrition, regular physical activity, and behavioral modifications, while taking antiobesity medication.  Patient verbalized understanding that medication is an adjunct to, not a replacement for, lifestyle changes and that the long-term success and weight maintenance depend on continued adherence to these strategies.  Associated Conditions Impacted by Obesity Treatment  Assessment & Plan OSA on CPAP On CPAP with reported good compliance. Continue PAP therapy.  She has lost 17% of total body weight.  Continue current weight management strategy Class 2 severe obesity with serious comorbidity and body mass index (BMI) of 36.0 to 36.9 in adult, unspecified obesity type   Weight: decrease of 49 lb (17.8%) over 1 year, 8 months  Start: 03/16/2022 275 lb (124.7 kg)  End: 11/13/2023 226 lb (102.5 kg)  Obesity management is ongoing with a current weight of 226 pounds, reflecting a total weight loss of 49 pounds over a year and seven months. She is following a 1500 calorie diet and exercises five days a week, primarily cardio. Muscle mass has fluctuated due to previous steroid use, but current muscle mass is at 118 pounds. Body fat percentage is expected to be in the 40s, with a goal to reduce it further. The basal metabolic rate is approximately 1700 calories, and a further calorie deficit is needed to continue weight loss. HIIT is recommended to efficiently combine cardio and  strength training, which can help modulate inflammation, improve insulin  sensitivity, and promote the browning of fat. - Introduce high-intensity interval training (HIIT) once or twice a week to combine cardio and strength training. - Provide a 1200 calorie meal plan to increase calorie deficit. - Encourage protein intake of at least 90 grams per day. - Adjust calorie intake to 1200 calories, with an option for a 200 calorie protein snack if hungry. Type 2 diabetes mellitus with other specified complication, unspecified whether long term insulin  use (HCC) Her A1c is 5.0, indicating excellent blood sugar control. Her blood pressure is well controlled, and she is not on any antihypertensive medications.  Currently on Mounjaro  without any adverse effects.  Continue current weight management strategy  Pure hypercholesterolemia Family history of premature CAD Hyperlipidemia is managed with rosuvastatin 20 mg, which is well-tolerated. Cardiometabolic risk is reduced with optimal control of cholesterol, blood pressure, and glycemic levels. Family history of early CAD is noted, and preventive cardiology consultation is advised to assess any additional recommendations for cardiovascular risk management. - Continue rosuvastatin 20 mg daily. - Has an appointment with preventive cardiology due to family history of premature CAD        Objective   Physical Exam:  Blood pressure 106/67, pulse 81, height 5' 7 (1.702 m), weight 226 lb (102.5 kg), last menstrual period 11/06/2023, SpO2 97%. Body mass index is  35.4 kg/m.  General: She is overweight, cooperative, alert, well developed, and in no acute distress. PSYCH: Has normal mood, affect and thought process.   HEENT: EOMI, sclerae are anicteric. Lungs: Normal breathing effort, no conversational dyspnea. Extremities: No edema.  Neurologic: No gross sensory or motor deficits. No tremors or fasciculations noted.    Diagnostic Data  Reviewed:  BMET    Component Value Date/Time   NA 139 02/15/2023 0000   K 4.5 02/15/2023 0000   CL 105 02/15/2023 0000   CO2 25 (A) 02/15/2023 0000   GLUCOSE 90 03/02/2022 1034   GLUCOSE 101 (H) 07/10/2015 1552   BUN 9 03/02/2022 1034   CREATININE 0.8 02/15/2023 0000   CREATININE 0.74 03/02/2022 1034   CALCIUM 10.1 02/15/2023 0000   GFRNONAA >60 07/10/2015 1552   GFRAA >60 07/10/2015 1552   Lab Results  Component Value Date   HGBA1C 5.9 (H) 06/20/2022   HGBA1C 5.8 (H) 03/02/2022   Lab Results  Component Value Date   INSULIN  23.8 03/02/2022   Lab Results  Component Value Date   TSH 1.27 02/15/2023   CBC    Component Value Date/Time   WBC 5.9 02/15/2023 0000   WBC 6.8 07/10/2015 1552   RBC 4.83 02/15/2023 0000   HGB 13.3 03/02/2022 1034   HCT 41.9 03/02/2022 1034   PLT 360 03/02/2022 1034   MCV 85 03/02/2022 1034   MCH 27.1 03/02/2022 1034   MCH 26.6 07/10/2015 1552   MCHC 31.7 03/02/2022 1034   MCHC 32.4 07/10/2015 1552   RDW 13.3 03/02/2022 1034   Iron Studies No results found for: IRON, TIBC, FERRITIN, IRONPCTSAT Lipid Panel     Component Value Date/Time   CHOL 152 02/15/2023 0000   CHOL 139 03/02/2022 1034   TRIG 80 02/15/2023 0000   HDL 40 02/15/2023 0000   HDL 39 (L) 03/02/2022 1034   LDLCALC 96 02/15/2023 0000   LDLCALC 82 03/02/2022 1034   Hepatic Function Panel     Component Value Date/Time   PROT 7.4 03/02/2022 1034   ALBUMIN 4.7 03/02/2022 1034   AST 23 02/15/2023 0000   ALT 22 02/15/2023 0000   ALKPHOS 71 02/15/2023 0000   BILITOT 0.3 03/02/2022 1034      Component Value Date/Time   TSH 1.27 02/15/2023 0000   TSH 1.330 03/02/2022 1034   Nutritional Lab Results  Component Value Date   VD25OH 44.1 02/15/2023   VD25OH 31.4 06/20/2022   VD25OH 26.9 (L) 03/02/2022    Medications: Outpatient Encounter Medications as of 11/13/2023  Medication Sig   bisacodyl  (DULCOLAX) 5 MG EC tablet Take 1 tablet (5 mg total) by mouth  daily as needed for moderate constipation.   cetirizine  (ZYRTEC ) 10 MG tablet Take 1-2 tablets 1-2 times daily for itch control   Cholecalciferol (VITAMIN D3) 50 MCG (2000 UT) CAPS Take 1 capsule (2,000 Units total) by mouth daily.   halobetasol  (ULTRAVATE ) 0.05 % cream Apply to affected areas once or twice daily   Multiple Vitamins-Minerals (CENTRUM MINIS ADULTS 50+ PO) Take by mouth.   Multiple Vitamins-Minerals (OCUVITE ADULT 50+) CAPS Take by mouth.   omeprazole (PRILOSEC) 40 MG capsule Take 40 mg by mouth daily.   polyethylene glycol powder (GLYCOLAX /MIRALAX ) 17 GM/SCOOP powder Take 17 g by mouth 2 (two) times daily as needed.   rosuvastatin (CRESTOR) 20 MG tablet Take 20 mg by mouth daily.   tirzepatide  (MOUNJARO ) 12.5 MG/0.5ML Pen Inject 12.5 mg into the skin once a week.  No facility-administered encounter medications on file as of 11/13/2023.     Follow-Up   Return in about 6 weeks (around 12/25/2023) for For Weight Mangement with Dr. Francyne.SABRA She was informed of the importance of frequent follow up visits to maximize her success with intensive lifestyle modifications for her multiple health conditions.  Attestation Statement   Reviewed by clinician on day of visit: allergies, medications, problem list, medical history, surgical history, family history, social history, and previous encounter notes.     Lucas Francyne, MD

## 2023-11-24 ENCOUNTER — Ambulatory Visit: Payer: Self-pay | Attending: Internal Medicine | Admitting: Internal Medicine

## 2023-11-24 ENCOUNTER — Encounter: Payer: Self-pay | Admitting: Internal Medicine

## 2023-11-24 VITALS — BP 98/61 | HR 77 | Ht 67.0 in | Wt 229.4 lb

## 2023-11-24 DIAGNOSIS — E7800 Pure hypercholesterolemia, unspecified: Secondary | ICD-10-CM | POA: Insufficient documentation

## 2023-11-24 DIAGNOSIS — E785 Hyperlipidemia, unspecified: Secondary | ICD-10-CM | POA: Insufficient documentation

## 2023-11-24 DIAGNOSIS — R079 Chest pain, unspecified: Secondary | ICD-10-CM | POA: Diagnosis present

## 2023-11-24 DIAGNOSIS — R000 Tachycardia, unspecified: Secondary | ICD-10-CM | POA: Insufficient documentation

## 2023-11-24 DIAGNOSIS — Z8249 Family history of ischemic heart disease and other diseases of the circulatory system: Secondary | ICD-10-CM | POA: Diagnosis present

## 2023-11-24 DIAGNOSIS — I2510 Atherosclerotic heart disease of native coronary artery without angina pectoris: Secondary | ICD-10-CM | POA: Insufficient documentation
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
## 2023-11-24 NOTE — Patient Instructions (Signed)
 Medication Instructions:  NO CHANGES  *If you need a refill on your cardiac medications before your next appointment, please call your pharmacy*  Lab Work: NMR lipoprofile, LPa, and BMET today -- 1st Floor If you have labs (blood work) drawn today and your tests are completely normal, you will receive your results only by: MyChart Message (if you have MyChart) OR A paper copy in the mail If you have any lab test that is abnormal or we need to change your treatment, we will call you to review the results.  Testing/Procedures: Coronary CTA -- you will get a call to schedule once approved with insurance  Follow-Up: At [facility], you and your health needs are our priority.  As part of our continuing mission to provide you with exceptional heart care, our providers are all part of one team.  This team includes your primary Cardiologist (physician) and Advanced Practice Providers or APPs (Physician Assistants and Nurse Practitioners) who all work together to provide you with the care you need, when you need it.  Your next appointment:    6-8 weeks with Dr. Mona or PA/NP for testing follow up   We recommend signing up for the patient portal called MyChart.  Sign up information is provided on this After Visit Summary.  MyChart is used to connect with patients for Virtual Visits (Telemedicine).  Patients are able to view lab/test results, encounter notes, upcoming appointments, etc.  Non-urgent messages can be sent to your provider as well.   To learn more about what you can do with MyChart, go to [email or web address].   Other Instructions   Your cardiac CT will be scheduled at one of the below locations:   [facility] [address] [place], [place] [address] [phone] (Severe contrast allergies only)  OR   [facility] [address] [place], [place] [address] [phone]  OR   [facility] [address] [place], [place] [address] [phone]  OR   Elspeth BIRCH. [facility] and Vascular Tower [address]  [place], [place] [address]  OR   [facility] [address] [place], [place] [phone]  If scheduled at [facility], please arrive at the [facility] and Children's Entrance (Entrance C2) of [facility] 30 minutes prior to test start time. You can use the FREE valet parking offered at entrance C (encouraged to control the heart rate for the test)  Proceed to the [facility] Radiology Department (first floor) to check-in and test prep.  All radiology patients and guests should use entrance C2 at [facility], accessed from [facility], even though the hospital's physical address listed is [address].  If scheduled at the Heart and Vascular Tower at [address], please enter the parking lot using the [address] entrance and use the FREE valet service at the patient drop-off area. Enter the building and check-in with registration on the main floor.  If scheduled at [facility], please arrive to the Heart and Vascular Center 15 mins early for check-in and test prep.  There is spacious parking and easy access to the radiology department from the [facility] Heart and Vascular entrance. Please enter here and check-in with the desk attendant.   If scheduled at [facility], please arrive 30 minutes early for check-in and test prep.  Please follow these instructions carefully (unless otherwise directed):  An IV will be required for this test  and Nitroglycerin will be given.  Hold all erectile dysfunction medications at least 3 days (72 hrs) prior to test. (Ie viagra, cialis, sildenafil, tadalafil, etc)   On the Night Before the Test: Be sure to Drink plenty of water. Do not consume any caffeinated/decaffeinated beverages or chocolate 12 hours prior to your test. Do not take any antihistamines 12 hours  prior to your test.   On the Day of the Test: Drink plenty of water until 1 hour prior to the test. Do not eat any food 1 hour prior to test. You may take your regular medications prior to the test.  Take metoprolol (Lopressor) two hours prior to test. If you take Furosemide/Hydrochlorothiazide/Spironolactone/Chlorthalidone, please HOLD on the morning of the test. Patients who wear a continuous glucose monitor MUST remove the device prior to scanning. FEMALES- please wear underwire-free bra if available, avoid dresses & tight clothing       After the Test: Drink plenty of water. After receiving IV contrast, you may experience a mild flushed feeling. This is normal. On occasion, you may experience a mild rash up to 24 hours after the test. This is not dangerous. If this occurs, you can take Benadryl 25 mg, Zyrtec , Claritin , or Allegra and increase your fluid intake. (Patients taking Tikosyn should avoid Benadryl, and may take Zyrtec , Claritin , or Allegra) If you experience trouble breathing, this can be serious. If it is severe call 911 IMMEDIATELY. If it is mild, please call our office.  We will call to schedule your test 2-4 weeks out understanding that some insurance companies will need an authorization prior to the service being performed.   For more information and frequently asked questions, please visit our website : [email or web address]  For non-scheduling related questions, please contact the cardiac imaging nurse navigator should you have any questions/concerns: Cardiac Imaging Nurse Navigators Direct Office Dial: [phone]   For scheduling needs, including cancellations and rescheduling, please call Brittany, [phone].

## 2023-11-27 LAB — BASIC METABOLIC PANEL WITH GFR
BUN/Creatinine Ratio: 14 (ref 9–23)
BUN: 10 mg/dL (ref 6–24)
CO2: 22 mmol/L (ref 20–29)
Calcium: 9.3 mg/dL (ref 8.7–10.2)
Chloride: 104 mmol/L (ref 96–106)
Creatinine, Ser: 0.71 mg/dL (ref 0.57–1.00)
Glucose: 76 mg/dL (ref 70–99)
Potassium: 4.4 mmol/L (ref 3.5–5.2)
Sodium: 140 mmol/L (ref 134–144)
eGFR: 102 mL/min/1.73 (ref >59)

## 2023-11-27 LAB — LIPOPROTEIN A (LPA): Lipoprotein (a): 161.9 nmol/L — AB (ref <75.0)

## 2023-11-27 LAB — NMR, LIPOPROFILE
Cholesterol, Total: 148 mg/dL (ref 100–199)
HDL Particle Number: 30.4 umol/L — ABNORMAL LOW (ref >=30.5)
HDL-C: 51 mg/dL (ref >39)
LDL Particle Number: 960 nmol/L (ref <1000)
LDL Size: 20.8 nm (ref >20.5)
LDL-C (NIH Calc): 84 mg/dL (ref 0–99)
LP-IR Score: 37 (ref <=45)
Small LDL Particle Number: 419 nmol/L (ref <=527)
Triglycerides: 62 mg/dL (ref 0–149)

## 2023-11-28 ENCOUNTER — Ambulatory Visit: Admitting: Podiatry

## 2023-11-28 DIAGNOSIS — M21372 Foot drop, left foot: Secondary | ICD-10-CM | POA: Diagnosis not present

## 2023-11-28 DIAGNOSIS — R269 Unspecified abnormalities of gait and mobility: Secondary | ICD-10-CM

## 2023-11-28 NOTE — Progress Notes (Signed)
  Subjective:  Patient ID: [name] [name], female    DOB: [birth year],  MRN: [id number]  Chief Complaint  Patient presents with   Difficulty Walking    4 week follow up for gait abnormality    Discussed the use of AI scribe software for clinical note transcription with the patient, who gave verbal consent to proceed.  History of Present Illness [name] [name] is a 53 year old female with diabetes who presents with left foot pain and neuropathy symptoms.  She has persistent left greater than right foot pain with tingling and falling asleep sensations, worse in shoes. She often walks on the sides of her feet due to discomfort. She suspects symptoms are related to diabetes.  She has nerve damage and had nerves ablated earlier this year, with expectation of some sensation returning later this year. She is not using neuropathy medications because of concern about side effects.  She has chronic balance problems and frequent falls and attends physical therapy and water therapy, which help strength. She looks down when walking because she mistrusts her footing.  She was told she had heart issues eight years ago that were not treated and has a cardiac CT scheduled soon.  She has painful toenails, especially the left great toenail, which catches on socks. She avoids trimming them due to pain and sensitivity along the sides of her feet.      Objective:    Physical Exam MUSCULOSKELETAL: Increased forefoot abduction bilaterally. Shortened stride length on left side with compensatory increased stride length on right side. High arches maintained through weight bearing. Left sided pelvic tilt. Shoulders tilt down towards left side during gait. Left knee slightly externally rotated. Left leg approximately 1-1.5 cm longer than right. 4/5 dorsiflexion and plantarflexion strength on left ankle. 4/5 inversion and eversion strength on left ankle. Decreased range of motion in left ankle joint. NEUROLOGICAL:  Protective sensation intact on left side. No significant neuropathy observed. SKIN: No significant recurrence of calluses at this point. Pedal skin well hydrated.   No images are attached to the encounter.    Results     Assessment:   1. Altered gait   2. Foot drop, left      Plan:  Patient was evaluated and treated and all questions answered.  Assessment and Plan Assessment & Plan Left foot drop with altered gait and chronic pain due to nerve damage and history of trauma Chronic left foot drop with altered gait and pain, likely due to nerve compression. Concerns about muscle weakness with fixed braces. Prefers to avoid medications. - Referred to pedorthist for fitting of an external brace for the left foot. - Initiated physical therapy focusing on balance and strengthening of the left foot. - Encouraged continuation of water therapy if previously beneficial. - Will schedule follow-up in 6-8 weeks to assess progress.        Return in about 8 weeks (around 01/23/2024) for Gait abnormality.

## 2023-12-01 ENCOUNTER — Encounter: Payer: Self-pay | Admitting: Podiatry

## 2023-12-04 ENCOUNTER — Ambulatory Visit: Payer: Self-pay | Admitting: Internal Medicine

## 2023-12-12 ENCOUNTER — Encounter (HOSPITAL_COMMUNITY): Payer: Self-pay

## 2023-12-14 ENCOUNTER — Ambulatory Visit (HOSPITAL_COMMUNITY)
Admission: RE | Admit: 2023-12-14 | Discharge: 2023-12-14 | Disposition: A | Discharge status: Home / Self Care | Source: Ambulatory Visit | Attending: Internal Medicine | Admitting: Internal Medicine

## 2023-12-14 DIAGNOSIS — R079 Chest pain, unspecified: Secondary | ICD-10-CM | POA: Diagnosis present

## 2023-12-14 DIAGNOSIS — I700 Atherosclerosis of aorta: Secondary | ICD-10-CM | POA: Diagnosis not present

## 2023-12-14 DIAGNOSIS — I2510 Atherosclerotic heart disease of native coronary artery without angina pectoris: Secondary | ICD-10-CM | POA: Diagnosis present

## 2023-12-14 MED ORDER — IOHEXOL 350 MG/ML SOLN
100.0000 mL | Freq: Once | INTRAVENOUS | Status: AC | PRN
  Administered 2023-12-14: 100 mL via INTRAVENOUS

## 2023-12-14 MED ORDER — NITROGLYCERIN 0.4 MG SL SUBL
0.8000 mg | SUBLINGUAL_TABLET | Freq: Once | SUBLINGUAL | Status: AC
  Administered 2023-12-14: 0.8 mg via SUBLINGUAL

## 2023-12-18 ENCOUNTER — Encounter (INDEPENDENT_AMBULATORY_CARE_PROVIDER_SITE_OTHER): Payer: Self-pay | Admitting: Internal Medicine

## 2023-12-18 ENCOUNTER — Ambulatory Visit (INDEPENDENT_AMBULATORY_CARE_PROVIDER_SITE_OTHER): Admitting: Internal Medicine

## 2023-12-18 VITALS — BP 110/75 | HR 83 | Temp 98.0°F | Ht 67.0 in | Wt 219.0 lb

## 2023-12-18 DIAGNOSIS — E1169 Type 2 diabetes mellitus with other specified complication: Secondary | ICD-10-CM

## 2023-12-18 DIAGNOSIS — Z6834 Body mass index (BMI) 34.0-34.9, adult: Secondary | ICD-10-CM

## 2023-12-18 DIAGNOSIS — E66811 Obesity, class 1: Secondary | ICD-10-CM

## 2023-12-18 DIAGNOSIS — Z7985 Long-term (current) use of injectable non-insulin antidiabetic drugs: Secondary | ICD-10-CM

## 2023-12-18 DIAGNOSIS — E7841 Elevated Lipoprotein(a): Secondary | ICD-10-CM

## 2023-12-18 DIAGNOSIS — R931 Abnormal findings on diagnostic imaging of heart and coronary circulation: Secondary | ICD-10-CM | POA: Diagnosis not present

## 2023-12-18 DIAGNOSIS — G4733 Obstructive sleep apnea (adult) (pediatric): Secondary | ICD-10-CM | POA: Diagnosis not present

## 2023-12-18 DIAGNOSIS — Z8249 Family history of ischemic heart disease and other diseases of the circulatory system: Secondary | ICD-10-CM | POA: Diagnosis not present

## 2023-12-18 MED ORDER — TIRZEPATIDE 12.5 MG/0.5ML ~~LOC~~ SOAJ: 12.5000 mg | SUBCUTANEOUS | 0 refills | Status: AC

## 2023-12-18 NOTE — Assessment & Plan Note (Signed)
 Weight: decrease of 56 lb (20.4%) over 1 year, 9 months  Start: 03/16/2022 275 lb (124.7 kg)  End: 12/18/2023 219 lb (99.3 kg)  She has lost 7 pounds since the last visit and is following a 1500 calorie nutrition target 80% of the time. She exercises four days a week for about 40 minutes. She is on Mounjaro  for diabetes and weight management. The weight loss is attributed to dietary changes and exercise. She is advised to avoid saturated fats and focus on healthy fats. - Continue Mounjaro  for weight management - Maintain 1500 calorie nutrition target - Exercise four days a week for 40 minutes - Avoid saturated fats and focus on healthy fats

## 2023-12-18 NOTE — Assessment & Plan Note (Signed)
 On CPAP with reported good compliance. Continue PAP therapy.  She has lost 17% of total body weight.  Continue current weight management strategy

## 2023-12-18 NOTE — Assessment & Plan Note (Signed)
 Reviewed recent labs and workup done by preventive cardiologist Dr. Mona.  Patient is currently on high-dose statin therapy and is being considered for treatment with Repatha.  Considering that she is above the age of 4 and has diabetes although in remission she may benefit from antiplatelet therapy.  She will discuss this with him.

## 2023-12-18 NOTE — Progress Notes (Signed)
 Office: [phone]  /  Fax: [phone]  Weight Summary and Body Composition Analysis (BIA)  Vitals Temp: 98 F (36.7 C) BP: 110/75 Pulse Rate: 83 SpO2: 98 %   Anthropometric Measurements Height: 5' 7 (1.702 m) Weight: 219 lb (99.3 kg) BMI (Calculated): 34.29 Weight at Last Visit: 226 lb Weight Lost Since Last Visit: 7 lb Weight Gained Since Last Visit: 0 lb Starting Weight: 275 lb Total Weight Loss (lbs): 56 lb (25.4 kg) Peak Weight: 300 lb   Body Composition  Body Fat %: 43.7 % Fat Mass (lbs): 96 lbs Muscle Mass (lbs): 117.6 lbs Total Body Water (lbs): 82.6 lbs Visceral Fat Rating : 11    RMR: [id number]  Today's Visit #: 20  Starting Date: 03/02/22   Subjective   Chief Complaint: Obesity  Interval History Discussed the use of AI scribe software for clinical note transcription with the patient, who gave verbal consent to proceed.  History of Present Illness Allan Bacigalupi is a 53 year old female with type 2 diabetes, hypercholesterolemia, and sleep apnea who presents for medical weight management.  She has lost seven pounds since her last visit and adheres to a 1500 calorie nutrition target 80% of the time. She exercises four days a week for about 40 minutes each session. She is currently on Mounjaro  for diabetes and weight management.  Her type 2 diabetes is well-controlled with Mounjaro , with an A1c of 5.0 in February. She experienced a transient increase in blood glucose to 139 mg/dL, causing dizziness, but it resolved to 103 mg/dL an hour later.  She has hypercholesterolemia and is on rosuvastatin 20 mg daily. A coronary CT showed a calcium score of 508, and her Lipoprotein(a) level is 161.9 mg/dL.  She has family history of coronary artery disease.  She has sleep apnea and uses CPAP therapy.  She reports constipation and is seeking a more natural way to manage it, as she has run out of her current medication.     Challenges affecting patient  progress: none.    Pharmacotherapy for weight management: She is currently taking Monjauro with diabetes as the primary indication and obesity secondary with adequate clinical response  and without side effects..   Assessment and Plan   Treatment Plan For Obesity:  Recommended Dietary Goals  Embree is currently in the action stage of change. As such, her goal is to continue weight management plan. She has agreed to: continue current plan  Behavioral Health and Counseling  We discussed the following behavioral modification strategies today: continue to work on maintaining a reduced calorie state, getting the recommended amount of protein, incorporating whole foods, making healthy choices, staying well hydrated and practicing mindfulness when eating. and increase protein intake, fibrous foods (25 grams per day for women, 30 grams for men) and water to improve satiety and decrease hunger signals. .  Additional education and resources provided today: None  Recommended Physical Activity Goals  Zeeva has been advised to work up to 150 minutes of moderate intensity aerobic activity a week and strengthening exercises 2-3 times per week for cardiovascular health, weight loss maintenance and preservation of muscle mass.  She has agreed to :  Think about enjoyable ways to increase daily physical activity and overcoming barriers to exercise, Increase physical activity in their day and reduce sedentary time (increase NEAT)., Increase volume of physical activity to a goal of 240 minutes a week, and Combine aerobic and strengthening exercises for efficiency and improved cardiometabolic health.  Medical Interventions and Pharmacotherapy  We discussed various medication options to help Jaella with her weight loss efforts and we both agreed to : Adequate clinical response to anti-obesity medication, continue current regimen  Associated Conditions Impacted by Obesity Treatment  Assessment & Plan OSA on  CPAP On CPAP with reported good compliance. Continue PAP therapy.  She has lost 17% of total body weight.  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 Mona.  Patient is currently on high-dose statin therapy and is being considered for treatment with Repatha.  Considering that she is above the age of 55 and has diabetes although in remission she may benefit from antiplatelet therapy.  She will discuss this with him. Class 1 obesity with serious comorbidity and body mass index (BMI) of 34.0 to 34.9 in adult, unspecified obesity type Type 2 diabetes mellitus with other specified complication, without long-term current use of insulin  (HCC) Weight: decrease of 56 lb (20.4%) over 1 year, 9 months  Start: 03/16/2022 275 lb (124.7 kg)  End: 12/18/2023 219 lb (99.3 kg)  She has lost 7 pounds since the last visit and is following a 1500 calorie nutrition target 80% of the time. She exercises four days a week for about 40 minutes. She is on Mounjaro  for diabetes and weight management. The weight loss is attributed to dietary changes and exercise. She is advised to avoid saturated fats and focus on healthy fats. - Continue Mounjaro  for weight management - Maintain 1500 calorie nutrition target - Exercise four days a week for 40 minutes - Avoid saturated fats and focus on healthy fats  Her diabetes is well controlled with an A1c of 5.0 as of February. She experienced a transient increase in blood glucose to 139 mg/dL, which resolved to 896 mg/dL after an hour. She is on Mounjaro , which also has cardiovascular benefits. The Mounjaro  may lower blood pressure, and she is advised to monitor for symptoms of hypotension. - Continue Mounjaro  for diabetes management - Monitor blood glucose levels - Increase sodium intake if experiencing low  blood pressure symptoms        Objective   Physical Exam:  Blood pressure 110/75, pulse 83, temperature 98 F (36.7 C), height 5' 7 (1.702 m), weight 219 lb (99.3 kg), last menstrual period 11/20/2023, SpO2 98%. Body mass index is 34.3 kg/m.  General: She is overweight, cooperative, alert, well developed, and in no acute distress. PSYCH: Has normal mood, affect and thought process.   HEENT: EOMI, sclerae are anicteric. Lungs: Normal breathing effort, no conversational dyspnea. Extremities: No edema.  Neurologic: No gross sensory or motor deficits. No tremors or fasciculations noted.    Diagnostic Data Reviewed:  BMET    Component Value Date/Time   NA 140 11/24/2023 1134   K 4.4 11/24/2023 1134   CL 104 11/24/2023 1134   CO2 22 11/24/2023 1134   GLUCOSE 76 11/24/2023 1134   GLUCOSE 101 (H) 07/10/2015 1552   BUN 10 11/24/2023 1134   CREATININE 0.71 11/24/2023 1134   CALCIUM 9.3 11/24/2023 1134   GFRNONAA >60 07/10/2015 1552   GFRAA >60 07/10/2015 1552   Lab Results  Component Value Date   HGBA1C 5.9 (H) 06/20/2022   HGBA1C 5.8 (H) 03/02/2022   Lab Results  Component Value Date   INSULIN  23.8 03/02/2022   Lab Results  Component Value Date   TSH 1.27 02/15/2023   CBC  Component Value Date/Time   WBC 5.9 02/15/2023 0000   WBC 6.8 07/10/2015 1552   RBC 4.83 02/15/2023 0000   HGB 13.3 03/02/2022 1034   HCT 41.9 03/02/2022 1034   PLT 360 03/02/2022 1034   MCV 85 03/02/2022 1034   MCH 27.1 03/02/2022 1034   MCH 26.6 07/10/2015 1552   MCHC 31.7 03/02/2022 1034   MCHC 32.4 07/10/2015 1552   RDW 13.3 03/02/2022 1034   Iron Studies No results found for: IRON, TIBC, FERRITIN, IRONPCTSAT Lipid Panel     Component Value Date/Time   CHOL 152 02/15/2023 0000   CHOL 139 03/02/2022 1034   TRIG 80 02/15/2023 0000   HDL 40 02/15/2023 0000   HDL 39 (L) 03/02/2022 1034   LDLCALC 96 02/15/2023 0000   LDLCALC 82 03/02/2022 1034   Hepatic Function  Panel     Component Value Date/Time   PROT 7.4 03/02/2022 1034   ALBUMIN 4.7 03/02/2022 1034   AST 23 02/15/2023 0000   ALT 22 02/15/2023 0000   ALKPHOS 71 02/15/2023 0000   BILITOT 0.3 03/02/2022 1034      Component Value Date/Time   TSH 1.27 02/15/2023 0000   TSH 1.330 03/02/2022 1034   Nutritional Lab Results  Component Value Date   VD25OH 44.1 02/15/2023   VD25OH 31.4 06/20/2022   VD25OH 26.9 (L) 03/02/2022    Medications: Outpatient Encounter Medications as of 12/18/2023  Medication Sig   bisacodyl  (DULCOLAX) 5 MG EC tablet Take 1 tablet (5 mg total) by mouth daily as needed for moderate constipation. (Patient taking differently: Take 5 mg by mouth daily.)   cetirizine  (ZYRTEC ) 10 MG tablet Take 1-2 tablets 1-2 times daily for itch control (Patient taking differently: Take 10 mg by mouth as needed for allergies or rhinitis. Take 1-2 tablets 1-2 times daily for itch control)   Cholecalciferol (VITAMIN D3) 50 MCG (2000 UT) CAPS Take 1 capsule (2,000 Units total) by mouth daily.   halobetasol  (ULTRAVATE ) 0.05 % cream Apply to affected areas once or twice daily (Patient taking differently: Apply 1-2 Applications topically as needed (for affected area). Apply to affected areas once or twice daily)   Multiple Vitamins-Minerals (CENTRUM MINIS ADULTS 50+ PO) Take by mouth.   Multiple Vitamins-Minerals (OCUVITE ADULT 50+) CAPS Take by mouth.   omeprazole (PRILOSEC) 40 MG capsule Take 40 mg by mouth daily.   polyethylene glycol powder (GLYCOLAX /MIRALAX ) 17 GM/SCOOP powder Take 17 g by mouth 2 (two) times daily as needed.   rosuvastatin (CRESTOR) 20 MG tablet Take 20 mg by mouth daily.   [DISCONTINUED] tirzepatide  (MOUNJARO ) 12.5 MG/0.5ML Pen Inject 12.5 mg into the skin once a week.   tirzepatide  (MOUNJARO ) 12.5 MG/0.5ML Pen Inject 12.5 mg into the skin once a week.   No facility-administered encounter medications on file as of 12/18/2023.     Follow-Up   Return in about 6 weeks  (around 01/29/2024) for For Weight Mangement with Dr. Francyne.SABRA She was informed of the importance of frequent follow up visits to maximize her success with intensive lifestyle modifications for her multiple health conditions.  Attestation Statement   Reviewed by clinician on day of visit: allergies, medications, problem list, medical history, surgical history, family history, social history, and previous encounter notes.     Lucas Francyne, MD

## 2023-12-19 ENCOUNTER — Telehealth: Payer: Self-pay | Admitting: Pharmacy Technician

## 2023-12-19 ENCOUNTER — Other Ambulatory Visit (HOSPITAL_COMMUNITY): Payer: Self-pay

## 2023-12-19 DIAGNOSIS — E785 Hyperlipidemia, unspecified: Secondary | ICD-10-CM

## 2023-12-19 NOTE — Telephone Encounter (Signed)
 Pharmacy Patient Advocate Encounter   Received notification from Physician's Office that prior authorization for Repatha is required/requested.   Insurance verification completed.   The patient is insured through [employer].   Per test claim: PA required; PA submitted to above mentioned insurance via Latent Key/confirmation #/EOC [id number] Status is pending

## 2023-12-20 NOTE — Telephone Encounter (Signed)
 Sent extra labs

## 2023-12-22 ENCOUNTER — Other Ambulatory Visit (HOSPITAL_COMMUNITY): Payer: Self-pay

## 2023-12-22 NOTE — Telephone Encounter (Signed)
 Pharmacy Patient Advocate Encounter  Received notification from [employer] that Prior Authorization for repatha has been APPROVED from 12/22/23 to 12/21/24 $4.00 one month  PA #/Case ID/Reference #: [id number]

## 2023-12-25 MED ORDER — REPATHA SURECLICK 140 MG/ML ~~LOC~~ SOAJ: 140.0000 mg | SUBCUTANEOUS | 11 refills | Status: AC

## 2023-12-25 NOTE — Addendum Note (Signed)
 Addended by: LORING ANDRIETTE HERO on: 12/25/2023 07:47 AM   Modules accepted: Orders
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
## 2024-01-11 ENCOUNTER — Ambulatory Visit: Admitting: General Practice

## 2024-01-16 ENCOUNTER — Encounter: Payer: Self-pay | Admitting: General Practice

## 2024-01-16 ENCOUNTER — Ambulatory Visit: Admitting: General Practice

## 2024-01-16 VITALS — BP 134/72 | HR 90 | Ht 67.0 in | Wt 225.2 lb

## 2024-01-16 DIAGNOSIS — I2510 Atherosclerotic heart disease of native coronary artery without angina pectoris: Secondary | ICD-10-CM | POA: Diagnosis not present

## 2024-01-16 DIAGNOSIS — E7800 Pure hypercholesterolemia, unspecified: Secondary | ICD-10-CM | POA: Insufficient documentation

## 2024-01-16 DIAGNOSIS — R079 Chest pain, unspecified: Secondary | ICD-10-CM | POA: Diagnosis not present

## 2024-01-16 DIAGNOSIS — G4733 Obstructive sleep apnea (adult) (pediatric): Secondary | ICD-10-CM | POA: Insufficient documentation

## 2024-01-16 MED ORDER — ASPIRIN 81 MG PO TBEC: 81.0000 mg | DELAYED_RELEASE_TABLET | Freq: Every day | ORAL | Status: AC

## 2024-01-16 NOTE — Patient Instructions (Signed)
 Medication Instructions:  START ASPIRIN  81 MG DAILY OVER THE COUNTER *If you need a refill on your cardiac medications before your next appointment, please call your pharmacy*  Lab Work: FASTING LIPID PANEL AND LFT THE BEGINNING OF APRIL 2026 If you have labs (blood work) drawn today and your tests are completely normal, you will receive your results only by: MyChart Message (if you have MyChart) OR A paper copy in the mail If you have any lab test that is abnormal or we need to change your treatment, we will call you to review the results.  Testing/Procedures: NO TESTING  Follow-Up: At [facility], you and your health needs are our priority.  As part of our continuing mission to provide you with exceptional heart care, our providers are all part of one team.  This team includes your primary Cardiologist (physician) and Advanced Practice Providers or APPs (Physician Assistants and Nurse Practitioners) who all work together to provide you with the care you need, when you need it.  Your next appointment:   6 month(s)  Provider:   Vinie JAYSON Maxcy, MD    We recommend signing up for the patient portal called MyChart.  Sign up information is provided on this After Visit Summary.  MyChart is used to connect with patients for Virtual Visits (Telemedicine).  Patients are able to view lab/test results, encounter notes, upcoming appointments, etc.  Non-urgent messages can be sent to your provider as well.   To learn more about what you can do with MyChart, go to [email or web address].   Other Instructions

## 2024-01-23 ENCOUNTER — Ambulatory Visit: Admitting: Podiatry

## 2024-01-23 DIAGNOSIS — M21372 Foot drop, left foot: Secondary | ICD-10-CM | POA: Diagnosis not present

## 2024-01-23 DIAGNOSIS — E1169 Type 2 diabetes mellitus with other specified complication: Secondary | ICD-10-CM | POA: Diagnosis not present

## 2024-01-23 DIAGNOSIS — L603 Nail dystrophy: Secondary | ICD-10-CM

## 2024-01-23 DIAGNOSIS — R269 Unspecified abnormalities of gait and mobility: Secondary | ICD-10-CM

## 2024-01-23 NOTE — Progress Notes (Signed)
 "  Subjective:  Patient ID: [name] [name], female    DOB: 1970/07/04,  MRN: [id number]  Chief Complaint  Patient presents with   [facility]    [facility] with callus A1c is unknown, ASA    Discussed the use of AI scribe software for clinical note transcription with the patient, who gave verbal consent to proceed.  History of Present Illness [name] [name] is a 54 year old female with diabetes who presents with left foot pain and neuropathy symptoms.  She has persistent left greater than right foot pain with tingling and falling asleep sensations, worse in shoes. She often walks on the sides of her feet due to discomfort. She suspects symptoms are related to diabetes.  She has nerve damage and had nerves ablated earlier this year, with expectation of some sensation returning later this year. She is not using neuropathy medications because of concern about side effects.  She has chronic balance problems and frequent falls and previously had benefit from physical therapy and water therapy, which help strength. She looks down when walking because she mistrusts her footing. Last visit did sned in referral for PT, she has not heard anything regarding this.  She was told she had heart issues eight years ago that were not treated and has a cardiac CT scheduled soon.  She has painful toenails, especially the left great toenail, which catches on socks. She avoids trimming them due to pain and sensitivity along the sides of her feet.   [name] [name] is a 54 year old female with left foot drop and nail dystrophy who presents for follow-up of toenail abnormalities and gait management.  She notes some overall improvement in her feet but remains concerned about persistent dystrophic toenails. She avoids self-trimming after prior overcutting complications and has not had professional nail care since December. She reports cracking and loosening of one toenail without pain, redness, drainage, or other signs of  infection.  She continues to have left foot drop with abnormal gait. She has not started physical therapy because therapy services have not contacted her. She owns a shoe-insert brace but has worn it only twice due to feeling restricted and less stable, with concern for increased fall risk. She prefers to avoid restrictive bracing and would like to pursue physical therapy to improve mobility and safety. She notes increased cold sensitivity in areas of prior fracture but no worsening neurologic or musculoskeletal symptoms.  She has diabetes. She denies current chest pain or heart failure symptoms.      Objective:    Physical Exam MUSCULOSKELETAL: Increased forefoot abduction bilaterally. Shortened stride length on left side with compensatory increased stride length on right side. High arches maintained through weight bearing. Left sided pelvic tilt. Shoulders tilt down towards left side during gait. Left knee slightly externally rotated. Left leg approximately 1-1.5 cm longer than right. 4/5 dorsiflexion and plantarflexion strength on left ankle. 4/5 inversion and eversion strength on left ankle. Decreased range of motion in left ankle joint. NEUROLOGICAL: Protective sensation intact on left side. No significant neuropathy observed. SKIN: No significant recurrence of calluses at this point. Pedal skin well hydrated.   EXTREMITIES: Toenail on foot is partially loose but well healed and firmly adhered. No cyanosis or edema.   No images are attached to the encounter.    Results Toenail debridement (trimming of dystrophic nail) Dystrophic toenail of left foot trimmed; distal portion loose and cracked at base, partially detached. Remaining nail plate firmly adhered. No signs of infection or poor healing.  Nail bed well healed. Advised continued monitoring for regrowth abnormalities.   Assessment:   1. Foot drop, left   2. Altered gait   3. Type 2 diabetes mellitus with other specified  complication, unspecified whether long term insulin  use (HCC)   4. Nail dystrophy      Plan:  Patient was evaluated and treated and all questions answered.  Assessment and Plan Assessment & Plan  Nail dystrophy Dystrophic toenail with crack at base, likely from prior trauma or procedure. Partially detached, well-healed surrounding area. Risk for abnormal regrowth.  - Debrided and trimmed loose portion of toenail as a courtesy. - Advised monitoring for changes, pain, increased detachment, or infection. - Instructed to contact clinic if symptomatic or removal desired. - Scheduled follow-up in three months for nail care and monitoring. - Arranged return visits for nail smoothing as needed.  Left foot drop with gait abnormality Chronic left foot drop causing gait disturbance. Physical therapy not initiated due to lack of contact. Current brace not tolerated due to restriction and fall risk. - Re-initiated referral for physical therapy for gait training and management. - Discussed alternative orthotic options, deferred bracing until after therapy trial. - Advised monitoring for worsening symptoms or increased fall risk, report issues promptly. - Scheduled follow-up in three months to reassess gait, foot drop, and need for further interventions.      Return in about 3 months (around 04/22/2024) for Diabetic Foot Care.     "

## 2024-01-23 NOTE — Patient Instructions (Signed)
 I have ordered a referral to physical therapy. If you do not hear for them about scheduling within the next 1-2 weeks, or you have any questions please give us  a call at [phone].

## 2024-01-24 ENCOUNTER — Ambulatory Visit (INDEPENDENT_AMBULATORY_CARE_PROVIDER_SITE_OTHER): Admitting: Internal Medicine

## 2024-01-29 ENCOUNTER — Ambulatory Visit (INDEPENDENT_AMBULATORY_CARE_PROVIDER_SITE_OTHER): Admitting: Internal Medicine

## 2024-02-05 ENCOUNTER — Ambulatory Visit (INDEPENDENT_AMBULATORY_CARE_PROVIDER_SITE_OTHER): Admitting: Internal Medicine

## 2024-02-08 ENCOUNTER — Ambulatory Visit (INDEPENDENT_AMBULATORY_CARE_PROVIDER_SITE_OTHER): Admitting: Internal Medicine

## 2024-02-14 ENCOUNTER — Ambulatory Visit (INDEPENDENT_AMBULATORY_CARE_PROVIDER_SITE_OTHER): Admitting: Internal Medicine

## 2024-04-23 ENCOUNTER — Ambulatory Visit: Admitting: Podiatry

## 2024-09-17 ENCOUNTER — Ambulatory Visit: Admitting: Family Medicine
